# Patient Record
Sex: Male | Born: 1950 | Marital: Married | Smoking: Former smoker
Health system: Southern US, Community
[De-identification: ages and names within clinical notes are randomized; demographics above are authoritative.]

## PROBLEM LIST (undated history)

## (undated) ENCOUNTER — Ambulatory Visit: Admission: EM | Discharge status: Against Medical Advice | Payer: Medicare HMO

## (undated) DIAGNOSIS — E785 Hyperlipidemia, unspecified: Secondary | ICD-10-CM

## (undated) DIAGNOSIS — E119 Type 2 diabetes mellitus without complications: Secondary | ICD-10-CM

## (undated) DIAGNOSIS — I10 Essential (primary) hypertension: Secondary | ICD-10-CM

## (undated) HISTORY — DX: Essential (primary) hypertension: I10

## (undated) HISTORY — DX: Type 2 diabetes mellitus without complications: E11.9

## (undated) HISTORY — DX: Hyperlipidemia, unspecified: E78.5

---

## 2005-12-26 ENCOUNTER — Ambulatory Visit: Payer: Self-pay

## 2013-08-19 ENCOUNTER — Ambulatory Visit: Payer: Self-pay | Admitting: Family Medicine

## 2014-05-19 DIAGNOSIS — E785 Hyperlipidemia, unspecified: Secondary | ICD-10-CM | POA: Diagnosis not present

## 2014-05-19 DIAGNOSIS — I10 Essential (primary) hypertension: Secondary | ICD-10-CM | POA: Diagnosis not present

## 2014-05-19 DIAGNOSIS — E119 Type 2 diabetes mellitus without complications: Secondary | ICD-10-CM | POA: Diagnosis not present

## 2014-06-11 DIAGNOSIS — Z1211 Encounter for screening for malignant neoplasm of colon: Secondary | ICD-10-CM | POA: Diagnosis not present

## 2014-06-11 DIAGNOSIS — R1310 Dysphagia, unspecified: Secondary | ICD-10-CM | POA: Diagnosis not present

## 2014-08-05 DIAGNOSIS — E785 Hyperlipidemia, unspecified: Secondary | ICD-10-CM | POA: Diagnosis not present

## 2014-08-05 DIAGNOSIS — I10 Essential (primary) hypertension: Secondary | ICD-10-CM | POA: Diagnosis not present

## 2014-08-05 DIAGNOSIS — E119 Type 2 diabetes mellitus without complications: Secondary | ICD-10-CM | POA: Diagnosis not present

## 2014-08-05 DIAGNOSIS — Z0000 Encounter for general adult medical examination without abnormal findings: Secondary | ICD-10-CM | POA: Diagnosis not present

## 2014-08-05 DIAGNOSIS — N401 Enlarged prostate with lower urinary tract symptoms: Secondary | ICD-10-CM | POA: Diagnosis not present

## 2014-10-31 DIAGNOSIS — E785 Hyperlipidemia, unspecified: Secondary | ICD-10-CM

## 2014-10-31 DIAGNOSIS — I10 Essential (primary) hypertension: Secondary | ICD-10-CM | POA: Insufficient documentation

## 2014-10-31 DIAGNOSIS — E1159 Type 2 diabetes mellitus with other circulatory complications: Secondary | ICD-10-CM | POA: Insufficient documentation

## 2014-10-31 DIAGNOSIS — I152 Hypertension secondary to endocrine disorders: Secondary | ICD-10-CM | POA: Insufficient documentation

## 2014-10-31 DIAGNOSIS — E669 Obesity, unspecified: Secondary | ICD-10-CM | POA: Insufficient documentation

## 2014-10-31 DIAGNOSIS — E1169 Type 2 diabetes mellitus with other specified complication: Secondary | ICD-10-CM | POA: Insufficient documentation

## 2014-11-03 ENCOUNTER — Encounter: Payer: Self-pay | Admitting: Family Medicine

## 2014-11-03 ENCOUNTER — Ambulatory Visit (INDEPENDENT_AMBULATORY_CARE_PROVIDER_SITE_OTHER): Payer: Commercial Managed Care - HMO | Admitting: Family Medicine

## 2014-11-03 VITALS — BP 121/72 | HR 85 | Temp 97.9°F | Ht 62.8 in | Wt 158.0 lb

## 2014-11-03 DIAGNOSIS — E785 Hyperlipidemia, unspecified: Secondary | ICD-10-CM | POA: Diagnosis not present

## 2014-11-03 DIAGNOSIS — I10 Essential (primary) hypertension: Secondary | ICD-10-CM

## 2014-11-03 DIAGNOSIS — E119 Type 2 diabetes mellitus without complications: Secondary | ICD-10-CM

## 2014-11-03 LAB — BAYER DCA HB A1C WAIVED: HB A1C (BAYER DCA - WAIVED): 6.7 % (ref <7.0)

## 2014-11-03 NOTE — Assessment & Plan Note (Signed)
The current medical regimen is effective;  continue present plan and medications.  

## 2014-11-03 NOTE — Progress Notes (Signed)
   BP 121/72 mmHg  Pulse 85  Temp(Src) 97.9 F (36.6 C)  Ht 5' 2.8" (1.595 m)  Wt 158 lb (71.668 kg)  BMI 28.17 kg/m2  SpO2 97%   Subjective:    Patient ID: Edward Yang, male    DOB: 16-Sep-1950, 64 y.o.   MRN: [id number]  HPI: Edward Yang is a 64 y.o. male  Chief Complaint  Patient presents with  . Diabetes   patient doing well with diabetes no low blood sugar spells taking medications faithfully having good control. Blood pressure doing well no side effects from medications Cholesterol doing well no side effects from medications Takes medications faithfully  Relevant past medical, surgical, family and social history reviewed and updated as indicated. Interim medical history since our last visit reviewed. Allergies and medications reviewed and updated.  Review of Systems  Constitutional: Negative.   Respiratory: Negative.   Cardiovascular: Negative.     Per HPI unless specifically indicated above     Objective:    BP 121/72 mmHg  Pulse 85  Temp(Src) 97.9 F (36.6 C)  Ht 5' 2.8" (1.595 m)  Wt 158 lb (71.668 kg)  BMI 28.17 kg/m2  SpO2 97%  Wt Readings from Last 3 Encounters:  11/03/14 158 lb (71.668 kg)  08/05/14 156 lb (70.761 kg)    Physical Exam  Constitutional: He is oriented to person, place, and time. He appears well-developed and well-nourished. No distress.  HENT:  Head: Normocephalic and atraumatic.  Right Ear: Hearing normal.  Left Ear: Hearing normal.  Nose: Nose normal.  Eyes: Conjunctivae and lids are normal. Right eye exhibits no discharge. Left eye exhibits no discharge. No scleral icterus.  Cardiovascular: Normal rate, regular rhythm and normal heart sounds.   Pulmonary/Chest: Effort normal and breath sounds normal. No respiratory distress.  Musculoskeletal: Normal range of motion.  Neurological: He is alert and oriented to person, place, and time.  Skin: Skin is intact. No rash noted.  Psychiatric: He has a normal mood and affect. His  speech is normal and behavior is normal. Judgment and thought content normal. Cognition and memory are normal.    No results found for this or any previous visit.    Assessment & Plan:   Problem List Items Addressed This Visit      Cardiovascular and Mediastinum   Hypertension    The current medical regimen is effective;  continue present plan and medications.       Relevant Orders   Basic metabolic panel     Endocrine   Diabetes mellitus without complication - Primary    The current medical regimen is effective;  continue present plan and medications.       Relevant Orders   Bayer DCA Hb A1c Waived   Bayer DCA Hb A1c Waived     Other   Hyperlipidemia    The current medical regimen is effective;  continue present plan and medications.       Relevant Orders   LP+ALT+AST+Glu Piccolo, Waived       Follow up plan: Return in about 3 months (around 02/03/2015), or if symptoms worsen or fail to improve, for med check.

## 2014-12-11 ENCOUNTER — Ambulatory Visit: Payer: Medicare HMO

## 2014-12-11 DIAGNOSIS — Z23 Encounter for immunization: Secondary | ICD-10-CM

## 2014-12-24 LAB — HM DIABETES EYE EXAM

## 2015-01-26 ENCOUNTER — Encounter: Payer: Self-pay | Admitting: Family Medicine

## 2015-01-26 ENCOUNTER — Ambulatory Visit (INDEPENDENT_AMBULATORY_CARE_PROVIDER_SITE_OTHER): Payer: Commercial Managed Care - HMO | Admitting: Family Medicine

## 2015-01-26 DIAGNOSIS — E119 Type 2 diabetes mellitus without complications: Secondary | ICD-10-CM | POA: Diagnosis not present

## 2015-01-26 DIAGNOSIS — E785 Hyperlipidemia, unspecified: Secondary | ICD-10-CM

## 2015-01-26 DIAGNOSIS — I10 Essential (primary) hypertension: Secondary | ICD-10-CM

## 2015-01-26 LAB — LP+ALT+AST+GLU PICCOLO, WAIVED
ALT (SGPT) PICCOLO, WAIVED: 71 U/L — AB (ref 10–47)
AST (SGOT) Piccolo, Waived: 50 U/L — ABNORMAL HIGH (ref 11–38)
CHOLESTEROL PICCOLO, WAIVED: 181 mg/dL (ref <200)
Chol/HDL Ratio Piccolo,Waive: 2.8 mg/dL
Glucose Piccolo, Waived: 92 mg/dL (ref 73–118)
HDL CHOL PICCOLO, WAIVED: 64 mg/dL (ref >59)
LDL CHOL CALC PICCOLO WAIVED: 73 mg/dL (ref <100)
TRIGLYCERIDES PICCOLO,WAIVED: 216 mg/dL — AB (ref <150)
VLDL CHOL CALC PICCOLO,WAIVE: 43 mg/dL — AB (ref <30)

## 2015-01-26 LAB — BAYER DCA HB A1C WAIVED: HB A1C: 7.1 % — AB (ref <7.0)

## 2015-01-26 MED ORDER — METFORMIN HCL 500 MG PO TABS: 500.0000 mg | ORAL_TABLET | Freq: Two times a day (BID) | ORAL | Status: DC

## 2015-01-26 MED ORDER — LISINOPRIL 10 MG PO TABS: 10.0000 mg | ORAL_TABLET | Freq: Every day | ORAL | Status: DC

## 2015-01-26 MED ORDER — GLIPIZIDE 5 MG PO TABS: 5.0000 mg | ORAL_TABLET | Freq: Every day | ORAL | Status: DC

## 2015-01-26 MED ORDER — PRAVASTATIN SODIUM 40 MG PO TABS: 40.0000 mg | ORAL_TABLET | Freq: Every day | ORAL | Status: DC

## 2015-01-26 NOTE — Assessment & Plan Note (Signed)
The current medical regimen is effective;  continue present plan and medications.  

## 2015-01-26 NOTE — Progress Notes (Signed)
BP 110/68 mmHg  Pulse 80  Temp(Src) 98.5 F (36.9 C)  Ht 5' 2.8" (1.595 m)  Wt 157 lb (71.215 kg)  BMI 27.99 kg/m2  SpO2 98%   Subjective:    Patient ID: Edward Yang, male    DOB: 14-Aug-1950, 64 y.o.   MRN: [id number]  HPI: Edward Yang is a 64 y.o. male  Chief Complaint  Patient presents with  . Hypertension  . Hyperlipidemia  . Diabetes   patient doing well accompanied by interpreter who translates for patient. Patient largely self-sufficient for English Blood pressures been doing well no complaints from medications Cholesterol doing well no complaints from medications Diabetes doing well no complaints medications no low blood sugar spells  Relevant past medical, surgical, family and social history reviewed and updated as indicated. Interim medical history since our last visit reviewed. Allergies and medications reviewed and updated.  Review of Systems  Constitutional: Negative.   Respiratory: Negative.   Cardiovascular: Negative.     Per HPI unless specifically indicated above     Objective:    BP 110/68 mmHg  Pulse 80  Temp(Src) 98.5 F (36.9 C)  Ht 5' 2.8" (1.595 m)  Wt 157 lb (71.215 kg)  BMI 27.99 kg/m2  SpO2 98%  Wt Readings from Last 3 Encounters:  01/26/15 157 lb (71.215 kg)  11/03/14 158 lb (71.668 kg)  08/05/14 156 lb (70.761 kg)    Physical Exam  Constitutional: He is oriented to person, place, and time. He appears well-developed and well-nourished. No distress.  HENT:  Head: Normocephalic and atraumatic.  Right Ear: Hearing normal.  Left Ear: Hearing normal.  Nose: Nose normal.  Eyes: Conjunctivae and lids are normal. Right eye exhibits no discharge. Left eye exhibits no discharge. No scleral icterus.  Cardiovascular: Normal rate, regular rhythm and normal heart sounds.   Pulmonary/Chest: Effort normal and breath sounds normal. No respiratory distress.  Musculoskeletal: Normal range of motion.  Neurological: He is alert and oriented to  person, place, and time.  Skin: Skin is intact. No rash noted.  Psychiatric: He has a normal mood and affect. His speech is normal and behavior is normal. Judgment and thought content normal. Cognition and memory are normal.    Results for orders placed or performed in visit on 11/03/14  Bayer DCA Hb A1c Waived  Result Value Ref Range   Bayer DCA Hb A1c Waived 6.7 <7.0 %      Assessment & Plan:   Problem List Items Addressed This Visit      Cardiovascular and Mediastinum   Hypertension    The current medical regimen is effective;  continue present plan and medications.       Relevant Medications   lisinopril (PRINIVIL,ZESTRIL) 10 MG tablet   pravastatin (PRAVACHOL) 40 MG tablet     Endocrine   Diabetes mellitus without complication (HCC)    The current medical regimen is effective;  continue present plan and medications.       Relevant Medications   glipiZIDE (GLUCOTROL) 5 MG tablet   lisinopril (PRINIVIL,ZESTRIL) 10 MG tablet   metFORMIN (GLUCOPHAGE) 500 MG tablet   pravastatin (PRAVACHOL) 40 MG tablet     Other   Hyperlipidemia    The current medical regimen is effective;  continue present plan and medications.       Relevant Medications   lisinopril (PRINIVIL,ZESTRIL) 10 MG tablet   pravastatin (PRAVACHOL) 40 MG tablet      Reviewed with patient that although his numbers are  marginal and weight loss better diet exercise is important Due to elevated liver enzymes will check lipid panel ALT, AST Office visit Follow up plan: Return in about 3 months (around 04/28/2015) for Med check, hemoglobin A1c, lipid panel, ALT, AST.

## 2015-01-27 ENCOUNTER — Encounter: Payer: Self-pay | Admitting: Family Medicine

## 2015-01-27 LAB — BASIC METABOLIC PANEL
BUN/Creatinine Ratio: 15 (ref 10–22)
BUN: 16 mg/dL (ref 8–27)
CALCIUM: 9.9 mg/dL (ref 8.6–10.2)
CO2: 28 mmol/L (ref 18–29)
Chloride: 99 mmol/L (ref 97–106)
Creatinine, Ser: 1.04 mg/dL (ref 0.76–1.27)
GFR calc Af Amer: 86 mL/min/{1.73_m2} (ref >59)
GFR, EST NON AFRICAN AMERICAN: 74 mL/min/{1.73_m2} (ref >59)
Glucose: 87 mg/dL (ref 65–99)
POTASSIUM: 4.5 mmol/L (ref 3.5–5.2)
Sodium: 140 mmol/L (ref 136–144)

## 2015-04-27 ENCOUNTER — Encounter: Payer: Self-pay | Admitting: Family Medicine

## 2015-04-27 ENCOUNTER — Ambulatory Visit (INDEPENDENT_AMBULATORY_CARE_PROVIDER_SITE_OTHER): Payer: Commercial Managed Care - HMO | Admitting: Family Medicine

## 2015-04-27 VITALS — BP 104/64 | HR 71 | Temp 98.1°F | Ht 61.1 in | Wt 154.0 lb

## 2015-04-27 DIAGNOSIS — I10 Essential (primary) hypertension: Secondary | ICD-10-CM | POA: Diagnosis not present

## 2015-04-27 DIAGNOSIS — E119 Type 2 diabetes mellitus without complications: Secondary | ICD-10-CM

## 2015-04-27 DIAGNOSIS — E785 Hyperlipidemia, unspecified: Secondary | ICD-10-CM

## 2015-04-27 LAB — LP+ALT+AST PICCOLO, WAIVED
ALT (SGPT) Piccolo, Waived: 59 U/L — ABNORMAL HIGH (ref 10–47)
AST (SGOT) Piccolo, Waived: 48 U/L — ABNORMAL HIGH (ref 11–38)
CHOL/HDL RATIO PICCOLO,WAIVE: 3.1 mg/dL
CHOLESTEROL PICCOLO, WAIVED: 203 mg/dL — AB (ref <200)
HDL Chol Piccolo, Waived: 66 mg/dL (ref >59)
LDL CHOL CALC PICCOLO WAIVED: 105 mg/dL — AB (ref <100)
Triglycerides Piccolo,Waived: 160 mg/dL — ABNORMAL HIGH (ref <150)
VLDL Chol Calc Piccolo,Waive: 32 mg/dL — ABNORMAL HIGH (ref <30)

## 2015-04-27 LAB — BAYER DCA HB A1C WAIVED: HB A1C: 6.8 % (ref <7.0)

## 2015-04-27 NOTE — Assessment & Plan Note (Signed)
The current medical regimen is effective;  continue present plan and medications.  

## 2015-04-27 NOTE — Progress Notes (Signed)
BP 104/64 mmHg  Pulse 71  Temp(Src) 98.1 F (36.7 C)  Ht 5' 1.1" (1.552 m)  Wt 154 lb (69.854 kg)  BMI 29.00 kg/m2  SpO2 96%   Subjective:    Patient ID: Edward Yang, male    DOB: 02/18/1951, 65 y.o.   MRN: [id number]  HPI: Edward Yang is a 65 y.o. male  Chief Complaint  Patient presents with  . Diabetes  . Hyperlipidemia   patient here with interpreter who assists with language  doing well no complaints from medications as lost 4 pounds been trying to eat better No side effects from medications noted low blood sugar spells and taking faithfully Concerned about elevated liver enzymes last visit patient's been doing better with weight loss and diet Blood pressure doing well Relevant past medical, surgical, family and social history reviewed and updated as indicated. Interim medical history since our last visit reviewed. Allergies and medications reviewed and updated.  Review of Systems  Constitutional: Negative.   Respiratory: Negative.   Cardiovascular: Negative.     Per HPI unless specifically indicated above     Objective:    BP 104/64 mmHg  Pulse 71  Temp(Src) 98.1 F (36.7 C)  Ht 5' 1.1" (1.552 m)  Wt 154 lb (69.854 kg)  BMI 29.00 kg/m2  SpO2 96%  Wt Readings from Last 3 Encounters:  04/27/15 154 lb (69.854 kg)  01/26/15 157 lb (71.215 kg)  11/03/14 158 lb (71.668 kg)    Physical Exam  Constitutional: He is oriented to person, place, and time. He appears well-developed and well-nourished. No distress.  HENT:  Head: Normocephalic and atraumatic.  Right Ear: Hearing normal.  Left Ear: Hearing normal.  Nose: Nose normal.  Eyes: Conjunctivae and lids are normal. Right eye exhibits no discharge. Left eye exhibits no discharge. No scleral icterus.  Cardiovascular: Normal rate, regular rhythm and normal heart sounds.   Pulmonary/Chest: Effort normal and breath sounds normal. No respiratory distress.  Musculoskeletal: Normal range of motion.  Neurological:  He is alert and oriented to person, place, and time.  Skin: Skin is intact. No rash noted.  Psychiatric: He has a normal mood and affect. His speech is normal and behavior is normal. Judgment and thought content normal. Cognition and memory are normal.    Results for orders placed or performed in visit on 01/26/15  Bayer DCA Hb A1c Waived  Result Value Ref Range   Bayer DCA Hb A1c Waived 7.1 (H) <7.0 %  LP+ALT+AST+Glu Piccolo, Waived  Result Value Ref Range   ALT (SGPT) Piccolo, Waived 71 (H) 10 - 47 U/L   AST (SGOT) Piccolo, Waived 50 (H) 11 - 38 U/L   Glucose Piccolo, Waived 92 73 - 118 mg/dL   Cholesterol Piccolo, Waived 181 <200 mg/dL   HDL Chol Piccolo, Waived 64 >59 mg/dL   Triglycerides Piccolo,Waived 216 (H) <150 mg/dL   Chol/HDL Ratio Piccolo,Waive 2.8 mg/dL   LDL Chol Calc Piccolo Waived 73 <100 mg/dL   VLDL Chol Calc Piccolo,Waive 43 (H) <30 mg/dL  Basic metabolic panel  Result Value Ref Range   Glucose 87 65 - 99 mg/dL   BUN 16 8 - 27 mg/dL   Creatinine, Ser 4.01 0.76 - 1.27 mg/dL   GFR calc non Af Amer 74 >59 mL/min/1.73   GFR calc Af Amer 86 >59 mL/min/1.73   BUN/Creatinine Ratio 15 10 - 22   Sodium 140 136 - 144 mmol/L   Potassium 4.5 3.5 - 5.2 mmol/L  Chloride 99 97 - 106 mmol/L   CO2 28 18 - 29 mmol/L   Calcium 9.9 8.6 - 10.2 mg/dL      Assessment & Plan:   Problem List Items Addressed This Visit      Cardiovascular and Mediastinum   Hypertension    The current medical regimen is effective;  continue present plan and medications.         Endocrine   Diabetes mellitus without complication (HCC) - Primary    The current medical regimen is effective;  continue present plan and medications.       Relevant Orders   Bayer DCA Hb A1c Waived     Other   Hyperlipidemia    Lipids LDL are up isn't taking pravastatin but liver enzymes are improved will continue current dose work on improvement with diet       Other Visit Diagnoses    Hyperlipemia         Relevant Orders    LP+ALT+AST Piccolo, Waived        Follow up plan: Return in about 3 months (around 07/25/2015) for Physical Exam and A1C.

## 2015-04-27 NOTE — Assessment & Plan Note (Signed)
Lipids LDL are up isn't taking pravastatin but liver enzymes are improved will continue current dose work on improvement with diet

## 2015-08-12 ENCOUNTER — Encounter: Payer: Self-pay | Admitting: Family Medicine

## 2015-08-12 ENCOUNTER — Ambulatory Visit (INDEPENDENT_AMBULATORY_CARE_PROVIDER_SITE_OTHER): Payer: Commercial Managed Care - HMO | Admitting: Family Medicine

## 2015-08-12 VITALS — BP 104/63 | HR 85 | Temp 97.9°F | Ht 61.6 in | Wt 151.0 lb

## 2015-08-12 DIAGNOSIS — Z23 Encounter for immunization: Secondary | ICD-10-CM | POA: Diagnosis not present

## 2015-08-12 DIAGNOSIS — E785 Hyperlipidemia, unspecified: Secondary | ICD-10-CM

## 2015-08-12 DIAGNOSIS — I10 Essential (primary) hypertension: Secondary | ICD-10-CM | POA: Diagnosis not present

## 2015-08-12 DIAGNOSIS — E119 Type 2 diabetes mellitus without complications: Secondary | ICD-10-CM

## 2015-08-12 DIAGNOSIS — Z0000 Encounter for general adult medical examination without abnormal findings: Secondary | ICD-10-CM

## 2015-08-12 LAB — URINALYSIS, ROUTINE W REFLEX MICROSCOPIC
Bilirubin, UA: NEGATIVE
GLUCOSE, UA: NEGATIVE
KETONES UA: NEGATIVE
LEUKOCYTES UA: NEGATIVE
NITRITE UA: NEGATIVE
PROTEIN UA: NEGATIVE
RBC, UA: NEGATIVE
SPEC GRAV UA: 1.015 (ref 1.005–1.030)
Urobilinogen, Ur: 1 mg/dL (ref 0.2–1.0)
pH, UA: 5.5 (ref 5.0–7.5)

## 2015-08-12 LAB — BAYER DCA HB A1C WAIVED: HB A1C (BAYER DCA - WAIVED): 6.9 % (ref <7.0)

## 2015-08-12 MED ORDER — LISINOPRIL 10 MG PO TABS: 10.0000 mg | ORAL_TABLET | Freq: Every day | ORAL | Status: DC

## 2015-08-12 MED ORDER — METFORMIN HCL 500 MG PO TABS: 500.0000 mg | ORAL_TABLET | Freq: Two times a day (BID) | ORAL | Status: DC

## 2015-08-12 MED ORDER — PRAVASTATIN SODIUM 40 MG PO TABS: 40.0000 mg | ORAL_TABLET | Freq: Every day | ORAL | Status: DC

## 2015-08-12 MED ORDER — GLIPIZIDE 5 MG PO TABS
5.0000 mg | ORAL_TABLET | Freq: Every day | ORAL | Status: DC
Start: 2015-08-12 — End: 2016-08-20

## 2015-08-12 NOTE — Assessment & Plan Note (Signed)
The current medical regimen is effective;  continue present plan and medications.  

## 2015-08-12 NOTE — Patient Instructions (Signed)
Pneumococcal Conjugate Vaccine (PCV13)   1. Why get vaccinated?  Vaccination can protect both children and adults from pneumococcal disease.  Pneumococcal disease is caused by bacteria that can spread from person to person through close contact. It can cause ear infections, and it can also lead to more serious infections of the:  · Lungs (pneumonia),  · Blood (bacteremia), and  · Covering of the brain and spinal cord (meningitis).  Pneumococcal pneumonia is most common among adults. Pneumococcal meningitis can cause deafness and brain damage, and it kills about 1 child in 10 who get it.  Anyone can get pneumococcal disease, but children under 2 years of age and adults 65 years and older, people with certain medical conditions, and cigarette smokers are at the highest risk.  Before there was a vaccine, the [place] saw:  · more than 700 cases of meningitis,  · about 13,000 blood infections,  · about 5 million ear infections, and  · about 200 deaths  in children under 5 each year from pneumococcal disease. Since vaccine became available, severe pneumococcal disease in these children has fallen by 88%.  About 18,000 older adults die of pneumococcal disease each year in the [place].  Treatment of pneumococcal infections with penicillin and other drugs is not as effective as it used to be, because some strains of the disease have become resistant to these drugs. This makes prevention of the disease, through vaccination, even more important.  2. PCV13 vaccine  Pneumococcal conjugate vaccine (called PCV13) protects against 13 types of pneumococcal bacteria.  PCV13 is routinely given to children at 2, 4, 6, and 12-15 months of age. It is also recommended for children and adults 2 to 64 years of age with certain health conditions, and for all adults 65 years of age and older. Your doctor can give you details.  3. Some people should not get this vaccine  Anyone who has ever had a life-threatening allergic reaction  to a dose of this vaccine, to an earlier pneumococcal vaccine called PCV7, or to any vaccine containing diphtheria toxoid (for example, DTaP), should not get PCV13.  Anyone with a severe allergy to any component of PCV13 should not get the vaccine. Tell your doctor if the person being vaccinated has any severe allergies.  If the person scheduled for vaccination is not feeling well, your healthcare provider might decide to reschedule the shot on another day.  4. Risks of a vaccine reaction  With any medicine, including vaccines, there is a chance of reactions. These are usually mild and go away on their own, but serious reactions are also possible.  Problems reported following PCV13 varied by age and dose in the series. The most common problems reported among children were:  · About half became drowsy after the shot, had a temporary loss of appetite, or had redness or tenderness where the shot was given.  · About 1 out of 3 had swelling where the shot was given.  · About 1 out of 3 had a mild fever, and about 1 in 20 had a fever over 102.2°F.  · Up to about 8 out of 10 became fussy or irritable.  Adults have reported pain, redness, and swelling where the shot was given; also mild fever, fatigue, headache, chills, or muscle pain.  Young children who get PCV13 along with inactivated flu vaccine at the same time may be at increased risk for seizures caused by fever. Ask your doctor for more information.  Problems that   could happen after any vaccine:  · People sometimes faint after a medical procedure, including vaccination. Sitting or lying down for about 15 minutes can help prevent fainting, and injuries caused by a fall. Tell your doctor if you feel dizzy, or have vision changes or ringing in the ears.  · Some older children and adults get severe pain in the shoulder and have difficulty moving the arm where a shot was given. This happens very rarely.  · Any medication can cause a severe allergic reaction. Such  reactions from a vaccine are very rare, estimated at about 1 in a million doses, and would happen within a few minutes to a few hours after the vaccination.  As with any medicine, there is a very small chance of a vaccine causing a serious injury or death.  The safety of vaccines is always being monitored. For more information, visit: www.cdc.gov/vaccinesafety/  5. What if there is a serious reaction?  What should I look for?  · Look for anything that concerns you, such as signs of a severe allergic reaction, very high fever, or unusual behavior.  Signs of a severe allergic reaction can include hives, swelling of the face and throat, difficulty breathing, a fast heartbeat, dizziness, and weakness-usually within a few minutes to a few hours after the vaccination.  What should I do?  · If you think it is a severe allergic reaction or other emergency that can't wait, call 9-1-1 or get the person to the nearest hospital. Otherwise, call your doctor.  Reactions should be reported to the Vaccine Adverse Event Reporting System (VAERS). Your doctor should file this report, or you can do it yourself through the VAERS web site at www.vaers.hhs.gov, or by calling 1-800-822-7967.  VAERS does not give medical advice.  6. The National Vaccine Injury Compensation Program  The National Vaccine Injury Compensation Program (VICP) is a federal program that was created to compensate people who may have been injured by certain vaccines.  Persons who believe they may have been injured by a vaccine can learn about the program and about filing a claim by calling 1-800-338-2382 or visiting the VICP website at www.hrsa.gov/vaccinecompensation. There is a time limit to file a claim for compensation.  7. How can I learn more?  · Ask your healthcare provider. He or she can give you the vaccine package insert or suggest other sources of information.  · Call your local or state health department.  · Contact the Centers for Disease Control and  Prevention (CDC):    Call 1-800-232-4636 (1-800-CDC-INFO) or    Visit CDC's website at www.cdc.gov/vaccines  Vaccine Information Statement  PCV13 Vaccine (01/09/2014)     This information is not intended to replace advice given to you by your health care provider. Make sure you discuss any questions you have with your health care provider.     Document Released: 12/19/2005 Document Revised: 03/14/2014 Document Reviewed: 01/16/2014  Elsevier Interactive Patient Education ©2016 [employer]  Pneumococcal Conjugate Vaccine (PCV13)   1. Why get vaccinated?  Vaccination can protect both children and adults from pneumococcal disease.  Pneumococcal disease is caused by bacteria that can spread from person to person through close contact. It can cause ear infections, and it can also lead to more serious infections of the:  · Lungs (pneumonia),  · Blood (bacteremia), and  · Covering of the brain and spinal cord (meningitis).  Pneumococcal pneumonia is most common among adults. Pneumococcal meningitis can cause deafness and brain damage, and it   kills about 1 child in 10 who get it.  Anyone can get pneumococcal disease, but children under 2 years of age and adults 65 years and older, people with certain medical conditions, and cigarette smokers are at the highest risk.  Before there was a vaccine, the [place] saw:  · more than 700 cases of meningitis,  · about 13,000 blood infections,  · about 5 million ear infections, and  · about 200 deaths  in children under 5 each year from pneumococcal disease. Since vaccine became available, severe pneumococcal disease in these children has fallen by 88%.  About 18,000 older adults die of pneumococcal disease each year in the [place].  Treatment of pneumococcal infections with penicillin and other drugs is not as effective as it used to be, because some strains of the disease have become resistant to these drugs. This makes prevention of the disease, through vaccination,  even more important.  2. PCV13 vaccine  Pneumococcal conjugate vaccine (called PCV13) protects against 13 types of pneumococcal bacteria.  PCV13 is routinely given to children at 2, 4, 6, and 12-15 months of age. It is also recommended for children and adults 2 to 64 years of age with certain health conditions, and for all adults 65 years of age and older. Your doctor can give you details.  3. Some people should not get this vaccine  Anyone who has ever had a life-threatening allergic reaction to a dose of this vaccine, to an earlier pneumococcal vaccine called PCV7, or to any vaccine containing diphtheria toxoid (for example, DTaP), should not get PCV13.  Anyone with a severe allergy to any component of PCV13 should not get the vaccine. Tell your doctor if the person being vaccinated has any severe allergies.  If the person scheduled for vaccination is not feeling well, your healthcare provider might decide to reschedule the shot on another day.  4. Risks of a vaccine reaction  With any medicine, including vaccines, there is a chance of reactions. These are usually mild and go away on their own, but serious reactions are also possible.  Problems reported following PCV13 varied by age and dose in the series. The most common problems reported among children were:  · About half became drowsy after the shot, had a temporary loss of appetite, or had redness or tenderness where the shot was given.  · About 1 out of 3 had swelling where the shot was given.  · About 1 out of 3 had a mild fever, and about 1 in 20 had a fever over 102.2°F.  · Up to about 8 out of 10 became fussy or irritable.  Adults have reported pain, redness, and swelling where the shot was given; also mild fever, fatigue, headache, chills, or muscle pain.  Young children who get PCV13 along with inactivated flu vaccine at the same time may be at increased risk for seizures caused by fever. Ask your doctor for more information.  Problems that could happen  after any vaccine:  · People sometimes faint after a medical procedure, including vaccination. Sitting or lying down for about 15 minutes can help prevent fainting, and injuries caused by a fall. Tell your doctor if you feel dizzy, or have vision changes or ringing in the ears.  · Some older children and adults get severe pain in the shoulder and have difficulty moving the arm where a shot was given. This happens very rarely.  · Any medication can cause a severe allergic reaction. Such reactions   from a vaccine are very rare, estimated at about 1 in a million doses, and would happen within a few minutes to a few hours after the vaccination.  As with any medicine, there is a very small chance of a vaccine causing a serious injury or death.  The safety of vaccines is always being monitored. For more information, visit: www.cdc.gov/vaccinesafety/  5. What if there is a serious reaction?  What should I look for?  · Look for anything that concerns you, such as signs of a severe allergic reaction, very high fever, or unusual behavior.  Signs of a severe allergic reaction can include hives, swelling of the face and throat, difficulty breathing, a fast heartbeat, dizziness, and weakness-usually within a few minutes to a few hours after the vaccination.  What should I do?  · If you think it is a severe allergic reaction or other emergency that can't wait, call 9-1-1 or get the person to the nearest hospital. Otherwise, call your doctor.  Reactions should be reported to the Vaccine Adverse Event Reporting System (VAERS). Your doctor should file this report, or you can do it yourself through the VAERS web site at www.vaers.hhs.gov, or by calling 1-800-822-7967.  VAERS does not give medical advice.  6. The National Vaccine Injury Compensation Program  The National Vaccine Injury Compensation Program (VICP) is a federal program that was created to compensate people who may have been injured by certain vaccines.  Persons who  believe they may have been injured by a vaccine can learn about the program and about filing a claim by calling 1-800-338-2382 or visiting the VICP website at www.hrsa.gov/vaccinecompensation. There is a time limit to file a claim for compensation.  7. How can I learn more?  · Ask your healthcare provider. He or she can give you the vaccine package insert or suggest other sources of information.  · Call your local or state health department.  · Contact the Centers for Disease Control and Prevention (CDC):    Call 1-800-232-4636 (1-800-CDC-INFO) or    Visit CDC's website at www.cdc.gov/vaccines  Vaccine Information Statement  PCV13 Vaccine (01/09/2014)     This information is not intended to replace advice given to you by your health care provider. Make sure you discuss any questions you have with your health care provider.     Document Released: 12/19/2005 Document Revised: 03/14/2014 Document Reviewed: 01/16/2014  Elsevier Interactive Patient Education ©2016 [employer]

## 2015-08-12 NOTE — Progress Notes (Signed)
BP 104/63 mmHg  Pulse 85  Temp(Src) 97.9 F (36.6 C)  Ht 5' 1.6" (1.565 m)  Wt 151 lb (68.493 kg)  BMI 27.97 kg/m2  SpO2 95%   Subjective:    Patient ID: Edward Yang, male    DOB: 08/15/1950, 65 y.o.   MRN: [id number]  HPI: Edward Yang is a 65 y.o. male  Chief Complaint  Patient presents with  . Annual Exam  . Diabetes   Patient recheck for physical all in all doing well with no complaints noted low blood sugar spells no issues with medications good control of blood pressure Blood sugar doing well taking Pravachol without problems last visit liver enzymes were slightly elevated Relevant past medical, surgical, family and social history reviewed and updated as indicated. Interim medical history since our last visit reviewed. Allergies and medications reviewed and updated.  Review of Systems  Constitutional: Negative.   HENT: Negative.   Eyes: Negative.   Respiratory: Negative.   Cardiovascular: Negative.   Gastrointestinal: Negative.   Endocrine: Negative.   Genitourinary: Negative.   Musculoskeletal: Negative.   Skin: Negative.   Allergic/Immunologic: Negative.   Neurological: Negative.   Hematological: Negative.   Psychiatric/Behavioral: Negative.     Per HPI unless specifically indicated above     Objective:    BP 104/63 mmHg  Pulse 85  Temp(Src) 97.9 F (36.6 C)  Ht 5' 1.6" (1.565 m)  Wt 151 lb (68.493 kg)  BMI 27.97 kg/m2  SpO2 95%  Wt Readings from Last 3 Encounters:  08/12/15 151 lb (68.493 kg)  04/27/15 154 lb (69.854 kg)  01/26/15 157 lb (71.215 kg)    Physical Exam  Constitutional: He is oriented to person, place, and time. He appears well-developed and well-nourished.  HENT:  Head: Normocephalic and atraumatic.  Right Ear: External ear normal.  Left Ear: External ear normal.  Eyes: Conjunctivae and EOM are normal. Pupils are equal, round, and reactive to light.  Neck: Normal range of motion. Neck supple.  Cardiovascular: Normal rate,  regular rhythm, normal heart sounds and intact distal pulses.   Pulmonary/Chest: Effort normal and breath sounds normal.  Abdominal: Soft. Bowel sounds are normal. There is no splenomegaly or hepatomegaly.  Genitourinary: Rectum normal and penis normal.  Prostate mild enlarged  Musculoskeletal: Normal range of motion.  Neurological: He is alert and oriented to person, place, and time. He has normal reflexes.  Skin: No rash noted. No erythema.  Psychiatric: He has a normal mood and affect. His behavior is normal. Judgment and thought content normal.    Results for orders placed or performed in visit on 04/27/15  LP+ALT+AST Piccolo, Waived  Result Value Ref Range   ALT (SGPT) Piccolo, Waived 59 (H) 10 - 47 U/L   AST (SGOT) Piccolo, Waived 48 (H) 11 - 38 U/L   Cholesterol Piccolo, Waived 203 (H) <200 mg/dL   HDL Chol Piccolo, Waived 66 >59 mg/dL   Triglycerides Piccolo,Waived 160 (H) <150 mg/dL   Chol/HDL Ratio Piccolo,Waive 3.1 mg/dL   LDL Chol Calc Piccolo Waived 105 (H) <100 mg/dL   VLDL Chol Calc Piccolo,Waive 32 (H) <30 mg/dL  Bayer DCA Hb N8G Waived  Result Value Ref Range   Bayer DCA Hb A1c Waived 6.8 <7.0 %      Assessment & Plan:   Problem List Items Addressed This Visit      Cardiovascular and Mediastinum   Hypertension    The current medical regimen is effective;  continue present plan and  medications.       Relevant Medications   lisinopril (PRINIVIL,ZESTRIL) 10 MG tablet   pravastatin (PRAVACHOL) 40 MG tablet     Endocrine   Diabetes mellitus without complication (HCC)    The current medical regimen is effective;  continue present plan and medications.       Relevant Medications   glipiZIDE (GLUCOTROL) 5 MG tablet   lisinopril (PRINIVIL,ZESTRIL) 10 MG tablet   metFORMIN (GLUCOPHAGE) 500 MG tablet   pravastatin (PRAVACHOL) 40 MG tablet   Other Relevant Orders   Bayer DCA Hb A1c Waived     Other   Hyperlipidemia    The current medical regimen is  effective;  continue present plan and medications.       Relevant Medications   lisinopril (PRINIVIL,ZESTRIL) 10 MG tablet   pravastatin (PRAVACHOL) 40 MG tablet    Other Visit Diagnoses    Immunization due    -  Primary    Relevant Orders    Pneumococcal conjugate vaccine 13-valent IM (Completed)    Routine general medical examination at a health care facility        Relevant Orders    CBC with Differential/Platelet    Comprehensive metabolic panel    Lipid Panel w/o Chol/HDL Ratio    PSA    TSH    Urinalysis, Routine w reflex microscopic ([facility])    Healthcare maintenance        Relevant Orders    Hepatitis C Antibody        Follow up plan: Return in about 3 months (around 11/12/2015) for A1c.

## 2015-08-13 ENCOUNTER — Encounter: Payer: Self-pay | Admitting: Unknown Physician Specialty

## 2015-08-13 LAB — COMPREHENSIVE METABOLIC PANEL
ALT: 29 IU/L (ref 0–44)
AST: 21 IU/L (ref 0–40)
Albumin/Globulin Ratio: 1.6 (ref 1.2–2.2)
Albumin: 4.4 g/dL (ref 3.6–4.8)
Alkaline Phosphatase: 85 IU/L (ref 39–117)
BUN/Creatinine Ratio: 20 (ref 10–24)
BUN: 20 mg/dL (ref 8–27)
Bilirubin Total: 0.9 mg/dL (ref 0.0–1.2)
CO2: 23 mmol/L (ref 18–29)
Calcium: 9.6 mg/dL (ref 8.6–10.2)
Chloride: 98 mmol/L (ref 96–106)
Creatinine, Ser: 1 mg/dL (ref 0.76–1.27)
GFR calc Af Amer: 90 mL/min/{1.73_m2} (ref >59)
GFR calc non Af Amer: 78 mL/min/{1.73_m2} (ref >59)
Globulin, Total: 2.7 g/dL (ref 1.5–4.5)
Glucose: 83 mg/dL (ref 65–99)
Potassium: 4.4 mmol/L (ref 3.5–5.2)
Sodium: 139 mmol/L (ref 134–144)
Total Protein: 7.1 g/dL (ref 6.0–8.5)

## 2015-08-13 LAB — CBC WITH DIFFERENTIAL/PLATELET
BASOS ABS: 0 10*3/uL (ref 0.0–0.2)
Basos: 0 %
EOS (ABSOLUTE): 0.3 10*3/uL (ref 0.0–0.4)
Eos: 4 %
HEMOGLOBIN: 14.8 g/dL (ref 12.6–17.7)
Hematocrit: 43.3 % (ref 37.5–51.0)
IMMATURE GRANS (ABS): 0 10*3/uL (ref 0.0–0.1)
Immature Granulocytes: 0 %
LYMPHS ABS: 2.5 10*3/uL (ref 0.7–3.1)
LYMPHS: 34 %
MCH: 31.2 pg (ref 26.6–33.0)
MCHC: 34.2 g/dL (ref 31.5–35.7)
MCV: 91 fL (ref 79–97)
MONOCYTES: 8 %
Monocytes Absolute: 0.6 10*3/uL (ref 0.1–0.9)
Neutrophils Absolute: 4.1 10*3/uL (ref 1.4–7.0)
Neutrophils: 54 %
PLATELETS: 154 10*3/uL (ref 150–379)
RBC: 4.75 x10E6/uL (ref 4.14–5.80)
RDW: 13.3 % (ref 12.3–15.4)
WBC: 7.5 10*3/uL (ref 3.4–10.8)

## 2015-08-13 LAB — LIPID PANEL W/O CHOL/HDL RATIO
CHOLESTEROL TOTAL: 148 mg/dL (ref 100–199)
HDL: 68 mg/dL (ref >39)
LDL Calculated: 61 mg/dL (ref 0–99)
TRIGLYCERIDES: 96 mg/dL (ref 0–149)
VLDL CHOLESTEROL CAL: 19 mg/dL (ref 5–40)

## 2015-08-13 LAB — HEPATITIS C ANTIBODY: Hep C Virus Ab: <0.1 s/co ratio (ref 0.0–0.9)

## 2015-08-13 LAB — TSH: TSH: 1.48 u[IU]/mL (ref 0.450–4.500)

## 2015-08-13 LAB — PSA: Prostate Specific Ag, Serum: 0.6 ng/mL (ref 0.0–4.0)

## 2015-08-13 NOTE — Progress Notes (Signed)
Quick Note:  Normal labs. Patient notified by letter. ______ 

## 2015-11-16 ENCOUNTER — Encounter: Payer: Self-pay | Admitting: Family Medicine

## 2015-11-16 ENCOUNTER — Ambulatory Visit (INDEPENDENT_AMBULATORY_CARE_PROVIDER_SITE_OTHER): Payer: Commercial Managed Care - HMO | Admitting: Family Medicine

## 2015-11-16 VITALS — BP 119/70 | HR 80 | Temp 97.4°F | Wt 155.0 lb

## 2015-11-16 DIAGNOSIS — Z23 Encounter for immunization: Secondary | ICD-10-CM

## 2015-11-16 DIAGNOSIS — E119 Type 2 diabetes mellitus without complications: Secondary | ICD-10-CM

## 2015-11-16 DIAGNOSIS — I10 Essential (primary) hypertension: Secondary | ICD-10-CM

## 2015-11-16 DIAGNOSIS — E785 Hyperlipidemia, unspecified: Secondary | ICD-10-CM | POA: Diagnosis not present

## 2015-11-16 LAB — MICROALBUMIN, URINE WAIVED
CREATININE, URINE WAIVED: 50 mg/dL (ref 10–300)
Microalb, Ur Waived: 10 mg/L (ref 0–19)

## 2015-11-16 LAB — BAYER DCA HB A1C WAIVED: HB A1C (BAYER DCA - WAIVED): 7 % — ABNORMAL HIGH (ref <7.0)

## 2015-11-16 NOTE — Assessment & Plan Note (Signed)
The current medical regimen is effective;  continue present plan and medications.  

## 2015-11-16 NOTE — Progress Notes (Signed)
BP 119/70 (BP Location: Right Arm, Cuff Size: Small)   Pulse 80   Temp 97.4 F (36.3 C)   Wt 155 lb (70.3 kg) Comment: with shoes  SpO2 98%   BMI 28.72 kg/m    Subjective:    Patient ID: Edward Yang, male    DOB: [birth year], 65 y.o.   MRN: [id number]  HPI: Edward Phoenixsaac Y Yoak is a 65 y.o. male  Chief Complaint  Patient presents with  . Diabetes  Patient recheck diabetes been doing well noted low glucose spells here with interpreter who helps patient with speaking. No complaints from blood pressure or cholesterol medication taking faithfully also Patient has some slight urinary leakage just prior to urination will drip one or 2 drops of urine in his underpants when he has to go the bathroom badly. Otherwise no issues with urinary incontinence.  Relevant past medical, surgical, family and social history reviewed and updated as indicated. Interim medical history since our last visit reviewed. Allergies and medications reviewed and updated.  Review of Systems  Constitutional: Negative.   Respiratory: Negative.   Cardiovascular: Negative.     Per HPI unless specifically indicated above     Objective:    BP 119/70 (BP Location: Right Arm, Cuff Size: Small)   Pulse 80   Temp 97.4 F (36.3 C)   Wt 155 lb (70.3 kg) Comment: with shoes  SpO2 98%   BMI 28.72 kg/m   Wt Readings from Last 3 Encounters:  11/16/15 155 lb (70.3 kg)  08/12/15 151 lb (68.5 kg)  04/27/15 154 lb (69.9 kg)    Physical Exam  Constitutional: He is oriented to person, place, and time. He appears well-developed and well-nourished. No distress.  HENT:  Head: Normocephalic and atraumatic.  Right Ear: Hearing normal.  Left Ear: Hearing normal.  Nose: Nose normal.  Eyes: Conjunctivae and lids are normal. Right eye exhibits no discharge. Left eye exhibits no discharge. No scleral icterus.  Cardiovascular: Normal rate and regular rhythm.   Pulmonary/Chest: Effort normal and breath sounds normal. No respiratory  distress.  Musculoskeletal: Normal range of motion.  Neurological: He is alert and oriented to person, place, and time.  Skin: Skin is intact. No rash noted.  Psychiatric: He has a normal mood and affect. His speech is normal and behavior is normal. Judgment and thought content normal. Cognition and memory are normal.    Results for orders placed or performed in visit on 08/18/15  HM DIABETES EYE EXAM  Result Value Ref Range   HM Diabetic Eye Exam No Retinopathy No Retinopathy      Assessment & Plan:   Problem List Items Addressed This Visit      Cardiovascular and Mediastinum   Hypertension    The current medical regimen is effective;  continue present plan and medications.         Endocrine   Diabetes mellitus without complication (HCC) - Primary    The current medical regimen is effective;  continue present plan and medications.       Relevant Orders   Bayer DCA Hb A1c Waived   Microalbumin, Urine Waived     Other   Hyperlipidemia    The current medical regimen is effective;  continue present plan and medications.        Other Visit Diagnoses    Need for Tdap vaccination       Relevant Orders   Tdap vaccine greater than or equal to 7yo IM   Immunization due  Relevant Orders   Flu vaccine HIGH DOSE PF (Fluzone High dose)       Follow up plan: Return in about 3 months (around 02/15/2016) for Hemoglobin A1c, BMP,  Lipids, ALT, AST.

## 2016-02-15 ENCOUNTER — Encounter: Payer: Self-pay | Admitting: Family Medicine

## 2016-02-15 ENCOUNTER — Ambulatory Visit (INDEPENDENT_AMBULATORY_CARE_PROVIDER_SITE_OTHER): Payer: Commercial Managed Care - HMO | Admitting: Family Medicine

## 2016-02-15 VITALS — BP 114/70 | HR 90 | Temp 97.6°F | Ht 62.25 in | Wt 155.2 lb

## 2016-02-15 DIAGNOSIS — Z1211 Encounter for screening for malignant neoplasm of colon: Secondary | ICD-10-CM | POA: Diagnosis not present

## 2016-02-15 DIAGNOSIS — E119 Type 2 diabetes mellitus without complications: Secondary | ICD-10-CM | POA: Diagnosis not present

## 2016-02-15 DIAGNOSIS — I10 Essential (primary) hypertension: Secondary | ICD-10-CM | POA: Diagnosis not present

## 2016-02-15 DIAGNOSIS — E785 Hyperlipidemia, unspecified: Secondary | ICD-10-CM | POA: Diagnosis not present

## 2016-02-15 LAB — LP+ALT+AST PICCOLO, WAIVED
ALT (SGPT) Piccolo, Waived: 39 U/L (ref 10–47)
AST (SGOT) Piccolo, Waived: 32 U/L (ref 11–38)
CHOL/HDL RATIO PICCOLO,WAIVE: 2.4 mg/dL
Cholesterol Piccolo, Waived: 161 mg/dL (ref <200)
HDL Chol Piccolo, Waived: 66 mg/dL (ref >59)
LDL CHOL CALC PICCOLO WAIVED: 64 mg/dL (ref <100)
Triglycerides Piccolo,Waived: 156 mg/dL — ABNORMAL HIGH (ref <150)
VLDL CHOL CALC PICCOLO,WAIVE: 31 mg/dL — AB (ref <30)

## 2016-02-15 LAB — BAYER DCA HB A1C WAIVED: HB A1C (BAYER DCA - WAIVED): 7 % — ABNORMAL HIGH (ref <7.0)

## 2016-02-15 NOTE — Assessment & Plan Note (Signed)
The current medical regimen is effective;  continue present plan and medications.  

## 2016-02-15 NOTE — Progress Notes (Signed)
BP 114/70 (BP Location: Left Arm, Patient Position: Sitting, Cuff Size: Normal)   Pulse 90   Temp 97.6 F (36.4 C)   Ht 5' 2.25" (1.581 m)   Wt 155 lb 3.2 oz (70.4 kg)   SpO2 95%   BMI 28.16 kg/m    Subjective:    Patient ID: Edward Yang, male    DOB: 1951/02/26, 65 y.o.   MRN: [id number]  HPI: Edward Yang is a 65 y.o. male  Chief Complaint  Patient presents with  . Hypertension  . Hyperlipidemia  . Diabetes   Patient follow-up doing well concerned hasn't had colon cancer screening wants referral for colonoscopy. Also hasn't had a shingles vaccine once to get shingles vaccine will give prescription and patient will check with insurance on payment. Other conditions blood pressure good control no issues with medications. Cholesterol no problems or concerns from pravastatin. Diabetes noted low blood sugar spells or issues no problems with diabetes control.  Relevant past medical, surgical, family and social history reviewed and updated as indicated. Interim medical history since our last visit reviewed. Allergies and medications reviewed and updated.  Review of Systems  Constitutional: Negative.   Respiratory: Negative.   Cardiovascular: Negative.     Per HPI unless specifically indicated above     Objective:    BP 114/70 (BP Location: Left Arm, Patient Position: Sitting, Cuff Size: Normal)   Pulse 90   Temp 97.6 F (36.4 C)   Ht 5' 2.25" (1.581 m)   Wt 155 lb 3.2 oz (70.4 kg)   SpO2 95%   BMI 28.16 kg/m   Wt Readings from Last 3 Encounters:  02/15/16 155 lb 3.2 oz (70.4 kg)  11/16/15 155 lb (70.3 kg)  08/12/15 151 lb (68.5 kg)    Physical Exam  Constitutional: He is oriented to person, place, and time. He appears well-developed and well-nourished. No distress.  HENT:  Head: Normocephalic and atraumatic.  Right Ear: Hearing normal.  Left Ear: Hearing normal.  Nose: Nose normal.  Eyes: Conjunctivae and lids are normal. Right eye exhibits no discharge.  Left eye exhibits no discharge. No scleral icterus.  Cardiovascular: Normal rate, regular rhythm and normal heart sounds.   Pulmonary/Chest: Effort normal and breath sounds normal. No respiratory distress.  Musculoskeletal: Normal range of motion.  Neurological: He is alert and oriented to person, place, and time.  Skin: Skin is intact. No rash noted.  Psychiatric: He has a normal mood and affect. His speech is normal and behavior is normal. Judgment and thought content normal. Cognition and memory are normal.    Results for orders placed or performed in visit on 11/16/15  Bayer DCA Hb A1c Waived  Result Value Ref Range   Bayer DCA Hb A1c Waived 7.0 (H) <7.0 %  Microalbumin, Urine Waived  Result Value Ref Range   Microalb, Ur Waived 10 0 - 19 mg/L   Creatinine, Urine Waived 50 10 - 300 mg/dL   Microalb/Creat Ratio 30-300 (H) <30 mg/g      Assessment & Plan:   Problem List Items Addressed This Visit      Cardiovascular and Mediastinum   Hypertension - Primary    The current medical regimen is effective;  continue present plan and medications.       Relevant Orders   Basic metabolic panel   LP+ALT+AST [place], [place]     Endocrine   Diabetes mellitus without complication (HCC)    The current medical regimen is effective;  continue  present plan and medications.       Relevant Orders   Basic metabolic panel     Other   Hyperlipidemia    The current medical regimen is effective;  continue present plan and medications.       Relevant Orders   Basic metabolic panel   Bayer DCA Hb [id number] Waived    Other Visit Diagnoses    Colon cancer screening       Relevant Orders   Ambulatory referral to Gastroenterology       Follow up plan: Return in about 3 months (around 05/15/2016) for Hemoglobin A1c.

## 2016-02-16 ENCOUNTER — Encounter: Payer: Self-pay | Admitting: Family Medicine

## 2016-02-16 LAB — BASIC METABOLIC PANEL
BUN/Creatinine Ratio: 15 (ref 10–24)
BUN: 14 mg/dL (ref 8–27)
CALCIUM: 9.5 mg/dL (ref 8.6–10.2)
CO2: 25 mmol/L (ref 18–29)
CREATININE: 0.96 mg/dL (ref 0.76–1.27)
Chloride: 100 mmol/L (ref 96–106)
GFR, EST AFRICAN AMERICAN: 94 mL/min/{1.73_m2} (ref >59)
GFR, EST NON AFRICAN AMERICAN: 81 mL/min/{1.73_m2} (ref >59)
Glucose: 129 mg/dL — ABNORMAL HIGH (ref 65–99)
POTASSIUM: 4.3 mmol/L (ref 3.5–5.2)
Sodium: 139 mmol/L (ref 134–144)

## 2016-03-01 ENCOUNTER — Ambulatory Visit
Admission: EM | Admit: 2016-03-01 | Discharge: 2016-03-01 | Disposition: A | Discharge status: Home / Self Care | Payer: Commercial Managed Care - HMO | Attending: Internal Medicine | Admitting: Internal Medicine

## 2016-03-01 ENCOUNTER — Ambulatory Visit (INDEPENDENT_AMBULATORY_CARE_PROVIDER_SITE_OTHER): Payer: Commercial Managed Care - HMO

## 2016-03-01 ENCOUNTER — Encounter: Payer: Self-pay | Admitting: Emergency Medicine

## 2016-03-01 DIAGNOSIS — J40 Bronchitis, not specified as acute or chronic: Secondary | ICD-10-CM

## 2016-03-01 DIAGNOSIS — R05 Cough: Secondary | ICD-10-CM | POA: Diagnosis not present

## 2016-03-01 MED ORDER — AZITHROMYCIN 250 MG PO TABS: ORAL_TABLET | ORAL | 0 refills | Status: DC

## 2016-03-01 NOTE — ED Provider Notes (Signed)
CSN: [id number]     Arrival date & time 03/01/16  1759 History   First MD Initiated Contact with Patient 03/01/16 1836     Chief Complaint  Patient presents with  . Cough   (Consider location/radiation/quality/duration/timing/severity/associated sxs/prior Treatment) HPI  This 65 year old male presents with a 3 to four-week history of a cough was initially accompanied with Sinus symptoms with drainage and pressure. The cough gradually seemed to improve but has over the last several days worsened. It has been productive of white sputum. Patient does have a past history of smoking approximately 20 years ago but is currently not smoking. His sinus symptoms have resolved but he still continues to cough. He has had no fever or chills. He denies any shortness of breath.        Past Medical History:  Diagnosis Date  . Diabetes mellitus without complication (HCC)   . Hyperlipidemia   . Hypertension    History reviewed. No pertinent surgical history. Family History  Problem Relation Age of Onset  . Healthy Mother   . Healthy Father    Social History  Substance Use Topics  . Smoking status: Former Smoker    Quit date: 11/03/1994  . Smokeless tobacco: Never Used  . Alcohol use No    Review of Systems  Constitutional: Positive for activity change. Negative for chills, diaphoresis, fatigue and fever.  HENT: Positive for congestion, postnasal drip, rhinorrhea, sinus pain and sinus pressure.   Respiratory: Positive for cough. Negative for shortness of breath, wheezing and stridor.   All other systems reviewed and are negative.   Allergies  Patient has no known allergies.  Home Medications   Prior to Admission medications   Medication Sig Start Date End Date Taking? Authorizing Provider  glipiZIDE (GLUCOTROL) 5 MG tablet Take 1 tablet (5 mg total) by mouth daily before breakfast. 08/12/15  Yes Steele SizerMark A Crissman, MD  lisinopril (PRINIVIL,ZESTRIL) 10 MG tablet Take 1 tablet (10 mg total)  by mouth daily. 08/12/15  Yes Steele SizerMark A Crissman, MD  metFORMIN (GLUCOPHAGE) 500 MG tablet Take 1 tablet (500 mg total) by mouth 2 (two) times daily with a meal. 08/12/15  Yes Steele SizerMark A Crissman, MD  pravastatin (PRAVACHOL) 40 MG tablet Take 1 tablet (40 mg total) by mouth daily. 08/12/15  Yes Steele SizerMark A Crissman, MD  azithromycin (ZITHROMAX Z-PAK) 250 MG tablet Take per package instructions 03/01/16   Lutricia FeilWilliam P Kinzy Weyers, PA-C   Meds Ordered and Administered this Visit  Medications - No data to display  BP 119/73 (BP Location: Left Arm)   Pulse 80   Temp 97.3 F (36.3 C) (Tympanic)   Resp 16   Ht 5\' 2"  (1.575 m)   Wt 155 lb (70.3 kg)   SpO2 99%   BMI 28.35 kg/m  No data found.   Physical Exam  Constitutional: He is oriented to person, place, and time. He appears well-developed and well-nourished. No distress.  HENT:  Head: Normocephalic and atraumatic.  Right Ear: External ear normal.  Left Ear: External ear normal.  Nose: Nose normal.  Mouth/Throat: Oropharynx is clear and moist. No oropharyngeal exudate.  Eyes: EOM are normal. Pupils are equal, round, and reactive to light. Right eye exhibits no discharge. Left eye exhibits no discharge.  Neck: Normal range of motion. Neck supple.  Pulmonary/Chest: Effort normal and breath sounds normal. No respiratory distress. He has no wheezes. He has no rales.  Musculoskeletal: Normal range of motion.  Neurological: He is alert and oriented to person, place,  and time.  Skin: Skin is warm and dry. He is not diaphoretic.  Psychiatric: He has a normal mood and affect. His behavior is normal. Judgment and thought content normal.  Nursing note and vitals reviewed.   Urgent Care Course   Clinical Course     Procedures (including critical care time)  Labs Review Labs Reviewed - No data to display  Imaging Review Dg Chest 2 View  Result Date: 03/01/2016 CLINICAL DATA:  [id number] year old male with productive cough for the past 3 weeks. EXAM: CHEST  2 VIEW  COMPARISON:  No priors. FINDINGS: Lung volumes are normal. No consolidative airspace disease. No pleural effusions. No pneumothorax. No pulmonary nodule or mass noted. Pulmonary vasculature and the cardiomediastinal silhouette are within normal limits. IMPRESSION: No radiographic evidence of acute cardiopulmonary disease. Electronically Signed   By: Trudie Reedaniel  Entrikin M.D.   On: 03/01/2016 19:33     Visual Acuity Review  Right Eye Distance:   Left Eye Distance:   Bilateral Distance:    Right Eye Near:   Left Eye Near:    Bilateral Near:         MDM   1. Bronchitis    New Prescriptions   AZITHROMYCIN (ZITHROMAX Z-PAK) 250 MG TABLET    Take per package instructions  Plan: 1. Test/x-ray results and diagnosis reviewed with patient 2. rx as per orders; risks, benefits, potential side effects reviewed with patient 3. Recommend supportive treatment with Rest and fluids. Will prescribe Zithromax doesn't duration and lastly if his cough. Told him that the cough can last upwards of 6 weeks. Says he is not coughing bad at nighttime but could take over-the-counter cough syrups to help reduce the cough reflex. Continues to have problems he should follow-up with his primary care physician Dr. Dossie Arbourrissman 4. F/u prn if symptoms worsen or don't improve     Lutricia FeilWilliam P Reade Trefz, PA-C 03/01/16 1954

## 2016-03-01 NOTE — ED Triage Notes (Signed)
Patient stated he has had a productive cough for 3 weeks.

## 2016-05-30 ENCOUNTER — Encounter: Payer: Self-pay | Admitting: Family Medicine

## 2016-05-30 ENCOUNTER — Ambulatory Visit (INDEPENDENT_AMBULATORY_CARE_PROVIDER_SITE_OTHER): Payer: Medicare HMO | Admitting: Family Medicine

## 2016-05-30 VITALS — BP 132/72 | HR 79 | Ht 62.0 in | Wt 157.6 lb

## 2016-05-30 DIAGNOSIS — E785 Hyperlipidemia, unspecified: Secondary | ICD-10-CM | POA: Diagnosis not present

## 2016-05-30 DIAGNOSIS — I10 Essential (primary) hypertension: Secondary | ICD-10-CM

## 2016-05-30 DIAGNOSIS — E119 Type 2 diabetes mellitus without complications: Secondary | ICD-10-CM | POA: Diagnosis not present

## 2016-05-30 LAB — HEMOGLOBIN A1C
ESTIMATED AVERAGE GLUCOSE: 160 mg/dL
HEMOGLOBIN A1C: 7.2 % — AB (ref 4.8–5.6)

## 2016-05-30 NOTE — Assessment & Plan Note (Signed)
The current medical regimen is effective;  continue present plan and medications.  

## 2016-05-30 NOTE — Progress Notes (Signed)
BP 132/72   Pulse 79   Ht 5\' 2"  (1.575 m)   Wt 157 lb 9.6 oz (71.5 kg)   SpO2 95%   BMI 28.83 kg/m    Subjective:    Patient ID: Edward Yang, male    DOB: 1950/09/09, 66 y.o.   MRN: [id number]  HPI: Edward Yang is a 66 y.o. male  Chief Complaint  Patient presents with  . Follow-up  . Hypertension  Patient here assisted with translator. Asking about hepatitis B vaccine when over occupational risk for hepatitis B which patient does not meet any criteria decided not to get. Did discuss shingles vaccine gave patient a prescription. Reviewed blood pressure taking medications without problems good control. Reviewed diabetes patient has gained a couple of pounds has been exercising less and hemoglobin A1c is the highest is been on chart review. Cholesterol no issues  Relevant past medical, surgical, family and social history reviewed and updated as indicated. Interim medical history since our last visit reviewed. Allergies and medications reviewed and updated.  Review of Systems  Constitutional: Negative.   Respiratory: Negative.   Cardiovascular: Negative.     Per HPI unless specifically indicated above     Objective:    BP 132/72   Pulse 79   Ht 5\' 2"  (1.575 m)   Wt 157 lb 9.6 oz (71.5 kg)   SpO2 95%   BMI 28.83 kg/m   Wt Readings from Last 3 Encounters:  05/30/16 157 lb 9.6 oz (71.5 kg)  03/01/16 155 lb (70.3 kg)  02/15/16 155 lb 3.2 oz (70.4 kg)    Physical Exam  Constitutional: He is oriented to person, place, and time. He appears well-developed and well-nourished.  HENT:  Head: Normocephalic and atraumatic.  Eyes: Conjunctivae and EOM are normal.  Neck: Normal range of motion.  Cardiovascular: Normal rate, regular rhythm and normal heart sounds.   Pulmonary/Chest: Effort normal and breath sounds normal.  Musculoskeletal: Normal range of motion.  Neurological: He is alert and oriented to person, place, and time.  Skin: No erythema.  Psychiatric: He has a  normal mood and affect. His behavior is normal. Judgment and thought content normal.    Results for orders placed or performed in visit on 02/15/16  Basic metabolic panel  Result Value Ref Range   Glucose 129 (H) 65 - 99 mg/dL   BUN 14 8 - 27 mg/dL   Creatinine, Ser 4.09 0.76 - 1.27 mg/dL   GFR calc non Af Amer 81 >59 mL/min/1.73   GFR calc Af Amer 94 >59 mL/min/1.73   BUN/Creatinine Ratio 15 10 - 24   Sodium 139 134 - 144 mmol/L   Potassium 4.3 3.5 - 5.2 mmol/L   Chloride 100 96 - 106 mmol/L   CO2 25 18 - 29 mmol/L   Calcium 9.5 8.6 - 10.2 mg/dL  Bayer DCA Hb W1X Waived  Result Value Ref Range   Bayer DCA Hb A1c Waived 7.0 (H) <7.0 %  LP+ALT+AST Piccolo, Waived  Result Value Ref Range   ALT (SGPT) Piccolo, Waived 39 10 - 47 U/L   AST (SGOT) Piccolo, Waived 32 11 - 38 U/L   Cholesterol Piccolo, Waived 161 <200 mg/dL   HDL Chol Piccolo, Waived 66 >59 mg/dL   Triglycerides Piccolo,Waived 156 (H) <150 mg/dL   Chol/HDL Ratio Piccolo,Waive 2.4 mg/dL   LDL Chol Calc Piccolo Waived 64 <100 mg/dL   VLDL Chol Calc Piccolo,Waive 31 (H) <30 mg/dL      Assessment &  Plan:   Problem List Items Addressed This Visit      Cardiovascular and Mediastinum   Hypertension - Primary    The current medical regimen is effective;  continue present plan and medications.       Relevant Orders   Hemoglobin A1c     Endocrine   Diabetes mellitus without complication (HCC)    Worsening control with weight gain and poor exercise discuss lifestyle modification for better control      Relevant Orders   Hemoglobin A1c     Other   Hyperlipidemia    The current medical regimen is effective;  continue present plan and medications.       Relevant Orders   Hemoglobin A1c       Follow up plan: Return in about 3 months (around 08/30/2016) for Physical Exam, Hemoglobin A1c.

## 2016-05-30 NOTE — Assessment & Plan Note (Signed)
Worsening control with weight gain and poor exercise discuss lifestyle modification for better control

## 2016-06-21 DIAGNOSIS — Z1212 Encounter for screening for malignant neoplasm of rectum: Secondary | ICD-10-CM | POA: Diagnosis not present

## 2016-06-21 DIAGNOSIS — Z1211 Encounter for screening for malignant neoplasm of colon: Secondary | ICD-10-CM | POA: Diagnosis not present

## 2016-06-27 LAB — COLOGUARD: Cologuard: NEGATIVE

## 2016-06-28 ENCOUNTER — Telehealth: Payer: Self-pay

## 2016-06-28 NOTE — Telephone Encounter (Signed)
Pt aware.

## 2016-06-28 NOTE — Telephone Encounter (Signed)
Received fax from [facility], cologuard negative.

## 2016-07-13 ENCOUNTER — Other Ambulatory Visit: Payer: Self-pay | Admitting: Family Medicine

## 2016-07-13 DIAGNOSIS — E119 Type 2 diabetes mellitus without complications: Secondary | ICD-10-CM

## 2016-07-13 DIAGNOSIS — E785 Hyperlipidemia, unspecified: Secondary | ICD-10-CM

## 2016-07-13 DIAGNOSIS — I10 Essential (primary) hypertension: Secondary | ICD-10-CM

## 2016-08-20 ENCOUNTER — Other Ambulatory Visit: Payer: Self-pay | Admitting: Family Medicine

## 2016-08-20 DIAGNOSIS — E119 Type 2 diabetes mellitus without complications: Secondary | ICD-10-CM

## 2016-09-12 ENCOUNTER — Ambulatory Visit (INDEPENDENT_AMBULATORY_CARE_PROVIDER_SITE_OTHER): Payer: Medicare HMO | Admitting: Family Medicine

## 2016-09-12 ENCOUNTER — Encounter: Payer: Self-pay | Admitting: Family Medicine

## 2016-09-12 VITALS — BP 121/65 | HR 84 | Ht 63.39 in | Wt 152.0 lb

## 2016-09-12 DIAGNOSIS — Z1329 Encounter for screening for other suspected endocrine disorder: Secondary | ICD-10-CM | POA: Diagnosis not present

## 2016-09-12 DIAGNOSIS — Z7189 Other specified counseling: Secondary | ICD-10-CM | POA: Diagnosis not present

## 2016-09-12 DIAGNOSIS — E785 Hyperlipidemia, unspecified: Secondary | ICD-10-CM | POA: Diagnosis not present

## 2016-09-12 DIAGNOSIS — E119 Type 2 diabetes mellitus without complications: Secondary | ICD-10-CM | POA: Diagnosis not present

## 2016-09-12 DIAGNOSIS — Z125 Encounter for screening for malignant neoplasm of prostate: Secondary | ICD-10-CM

## 2016-09-12 DIAGNOSIS — E7800 Pure hypercholesterolemia, unspecified: Secondary | ICD-10-CM | POA: Diagnosis not present

## 2016-09-12 DIAGNOSIS — N400 Enlarged prostate without lower urinary tract symptoms: Secondary | ICD-10-CM | POA: Diagnosis not present

## 2016-09-12 DIAGNOSIS — I10 Essential (primary) hypertension: Secondary | ICD-10-CM

## 2016-09-12 DIAGNOSIS — Z0000 Encounter for general adult medical examination without abnormal findings: Secondary | ICD-10-CM

## 2016-09-12 LAB — URINALYSIS, ROUTINE W REFLEX MICROSCOPIC
BILIRUBIN UA: NEGATIVE
GLUCOSE, UA: NEGATIVE
Leukocytes, UA: NEGATIVE
Nitrite, UA: NEGATIVE
PROTEIN UA: NEGATIVE
RBC, UA: NEGATIVE
Specific Gravity, UA: 1.025 (ref 1.005–1.030)
UUROB: 1 mg/dL (ref 0.2–1.0)
pH, UA: 5.5 (ref 5.0–7.5)

## 2016-09-12 LAB — BAYER DCA HB A1C WAIVED: HB A1C: 6.3 % (ref <7.0)

## 2016-09-12 MED ORDER — LISINOPRIL 10 MG PO TABS: 10.0000 mg | ORAL_TABLET | Freq: Every day | ORAL | 12 refills | Status: DC

## 2016-09-12 MED ORDER — GLIPIZIDE 5 MG PO TABS: 5.0000 mg | ORAL_TABLET | Freq: Every day | ORAL | 12 refills | Status: DC

## 2016-09-12 MED ORDER — METFORMIN HCL 500 MG PO TABS: 500.0000 mg | ORAL_TABLET | Freq: Two times a day (BID) | ORAL | 12 refills | Status: DC

## 2016-09-12 MED ORDER — PRAVASTATIN SODIUM 40 MG PO TABS: 40.0000 mg | ORAL_TABLET | Freq: Every day | ORAL | 12 refills | Status: DC

## 2016-09-12 NOTE — Assessment & Plan Note (Signed)
A voluntary discussion about advance care planning including the explanation and discussion of advance directives was extensively discussed  with the patient.  Explanation about the health care proxy and Living will was reviewed and packet with forms with explanation of how to fill them out was given.    

## 2016-09-12 NOTE — Assessment & Plan Note (Signed)
Mild BPH no sx

## 2016-09-12 NOTE — Progress Notes (Signed)
BP 121/65   Pulse 84   Ht 5' 3.39" (1.61 m)   Wt 152 lb (68.9 kg)   SpO2 98%   BMI 26.60 kg/m    Subjective:    Patient ID: Edward Yang, male    DOB: [birth year], 66 y.o.   MRN: [id number]  HPI: Edward Yang is a 66 y.o. male  Chief Complaint  Patient presents with  . Annual Exam  Patient doing well with diabetes no concerns or issues noted low blood pressure blood sugar spells. Takes medications faithfully without side effects. Cholesterol also doing well without problems.  Relevant past medical, surgical, family and social history reviewed and updated as indicated. Interim medical history since our last visit reviewed. Allergies and medications reviewed and updated.  Review of Systems  Constitutional: Negative.   HENT: Negative.   Eyes: Negative.   Respiratory: Negative.   Cardiovascular: Negative.   Gastrointestinal: Negative.   Endocrine: Negative.   Genitourinary: Negative.   Musculoskeletal: Negative.   Skin: Negative.   Allergic/Immunologic: Negative.   Neurological: Negative.   Hematological: Negative.   Psychiatric/Behavioral: Negative.     Per HPI unless specifically indicated above     Objective:    BP 121/65   Pulse 84   Ht 5' 3.39" (1.61 m)   Wt 152 lb (68.9 kg)   SpO2 98%   BMI 26.60 kg/m   Wt Readings from Last 3 Encounters:  09/12/16 152 lb (68.9 kg)  05/30/16 157 lb 9.6 oz (71.5 kg)  03/01/16 155 lb (70.3 kg)    Physical Exam  Constitutional: He is oriented to person, place, and time. He appears well-developed and well-nourished.  HENT:  Head: Normocephalic and atraumatic.  Right Ear: External ear normal.  Left Ear: External ear normal.  Eyes: Conjunctivae and EOM are normal. Pupils are equal, round, and reactive to light.  Neck: Normal range of motion. Neck supple.  Cardiovascular: Normal rate, regular rhythm, normal heart sounds and intact distal pulses.   Pulmonary/Chest: Effort normal and breath sounds normal.  Abdominal: Soft.  Bowel sounds are normal. There is no splenomegaly or hepatomegaly.  Genitourinary: Rectum normal and penis normal.  Genitourinary Comments: BPH changes  Musculoskeletal: Normal range of motion.  Neurological: He is alert and oriented to person, place, and time. He has normal reflexes.  Skin: No rash noted. No erythema.  Psychiatric: He has a normal mood and affect. His behavior is normal. Judgment and thought content normal.    Results for orders placed or performed in visit on 06/28/16  Cologuard  Result Value Ref Range   Cologuard Negative       Assessment & Plan:   Problem List Items Addressed This Visit      Cardiovascular and Mediastinum   Hypertension    The current medical regimen is effective;  continue present plan and medications.       Relevant Medications   lisinopril (PRINIVIL,ZESTRIL) 10 MG tablet   pravastatin (PRAVACHOL) 40 MG tablet   Other Relevant Orders   Bayer DCA Hb A1c Waived   CBC with Differential/Platelet     Endocrine   Diabetes mellitus without complication (HCC)    The current medical regimen is effective;  continue present plan and medications.       Relevant Medications   glipiZIDE (GLUCOTROL) 5 MG tablet   lisinopril (PRINIVIL,ZESTRIL) 10 MG tablet   metFORMIN (GLUCOPHAGE) 500 MG tablet   pravastatin (PRAVACHOL) 40 MG tablet   Other Relevant Orders   Bayer  DCA Hb A1c Waived   Comprehensive metabolic panel   Urinalysis, Routine w reflex microscopic     Genitourinary   BPH (benign prostatic hyperplasia)    Mild BPH no sx        Other   Hyperlipidemia    The current medical regimen is effective;  continue present plan and medications.       Relevant Medications   lisinopril (PRINIVIL,ZESTRIL) 10 MG tablet   pravastatin (PRAVACHOL) 40 MG tablet   Other Relevant Orders   Bayer DCA Hb A1c Waived   Lipid panel   Advanced care planning/counseling discussion    A voluntary discussion about advance care planning including the  explanation and discussion of advance directives was extensively discussed  with the patient.  Explanation about the health care proxy and Living will was reviewed and packet with forms with explanation of how to fill them out was given.          Other Visit Diagnoses    Annual physical exam    -  Primary   Prostate cancer screening       Relevant Orders   PSA   Thyroid disorder screen       Relevant Orders   TSH       Follow up plan: Return for Hemoglobin A1c.

## 2016-09-12 NOTE — Assessment & Plan Note (Signed)
The current medical regimen is effective;  continue present plan and medications.  

## 2016-09-13 ENCOUNTER — Encounter: Payer: Self-pay | Admitting: Family Medicine

## 2016-09-13 LAB — COMPREHENSIVE METABOLIC PANEL
A/G RATIO: 2 (ref 1.2–2.2)
ALT: 25 IU/L (ref 0–44)
AST: 25 IU/L (ref 0–40)
Albumin: 4.7 g/dL (ref 3.6–4.8)
Alkaline Phosphatase: 89 IU/L (ref 39–117)
BUN / CREAT RATIO: 14 (ref 10–24)
BUN: 19 mg/dL (ref 8–27)
Bilirubin Total: 0.6 mg/dL (ref 0.0–1.2)
CALCIUM: 9.6 mg/dL (ref 8.6–10.2)
CO2: 24 mmol/L (ref 20–29)
Chloride: 101 mmol/L (ref 96–106)
Creatinine, Ser: 1.33 mg/dL — ABNORMAL HIGH (ref 0.76–1.27)
GFR, EST AFRICAN AMERICAN: 63 mL/min/{1.73_m2} (ref >59)
GFR, EST NON AFRICAN AMERICAN: 55 mL/min/{1.73_m2} — AB (ref >59)
GLOBULIN, TOTAL: 2.3 g/dL (ref 1.5–4.5)
Glucose: 160 mg/dL — ABNORMAL HIGH (ref 65–99)
POTASSIUM: 4.7 mmol/L (ref 3.5–5.2)
SODIUM: 141 mmol/L (ref 134–144)
TOTAL PROTEIN: 7 g/dL (ref 6.0–8.5)

## 2016-09-13 LAB — CBC WITH DIFFERENTIAL/PLATELET
BASOS: 0 %
Basophils Absolute: 0 10*3/uL (ref 0.0–0.2)
EOS (ABSOLUTE): 0.1 10*3/uL (ref 0.0–0.4)
EOS: 1 %
HEMATOCRIT: 44.1 % (ref 37.5–51.0)
Hemoglobin: 14.4 g/dL (ref 13.0–17.7)
IMMATURE GRANS (ABS): 0 10*3/uL (ref 0.0–0.1)
IMMATURE GRANULOCYTES: 0 %
Lymphocytes Absolute: 2.6 10*3/uL (ref 0.7–3.1)
Lymphs: 29 %
MCH: 31.6 pg (ref 26.6–33.0)
MCHC: 32.7 g/dL (ref 31.5–35.7)
MCV: 97 fL (ref 79–97)
MONOS ABS: 0.4 10*3/uL (ref 0.1–0.9)
Monocytes: 4 %
NEUTROS PCT: 66 %
Neutrophils Absolute: 6.1 10*3/uL (ref 1.4–7.0)
Platelets: 142 10*3/uL — ABNORMAL LOW (ref 150–379)
RBC: 4.56 x10E6/uL (ref 4.14–5.80)
RDW: 13 % (ref 12.3–15.4)
WBC: 9.2 10*3/uL (ref 3.4–10.8)

## 2016-09-13 LAB — PSA: PROSTATE SPECIFIC AG, SERUM: 0.7 ng/mL (ref 0.0–4.0)

## 2016-09-13 LAB — LIPID PANEL
CHOL/HDL RATIO: 2.5 ratio (ref 0.0–5.0)
Cholesterol, Total: 159 mg/dL (ref 100–199)
HDL: 63 mg/dL (ref >39)
LDL Calculated: 58 mg/dL (ref 0–99)
Triglycerides: 192 mg/dL — ABNORMAL HIGH (ref 0–149)
VLDL Cholesterol Cal: 38 mg/dL (ref 5–40)

## 2016-09-13 LAB — TSH: TSH: 1.04 u[IU]/mL (ref 0.450–4.500)

## 2016-12-19 ENCOUNTER — Encounter: Payer: Self-pay | Admitting: Family Medicine

## 2016-12-19 ENCOUNTER — Ambulatory Visit (INDEPENDENT_AMBULATORY_CARE_PROVIDER_SITE_OTHER): Payer: Medicare HMO | Admitting: Family Medicine

## 2016-12-19 ENCOUNTER — Ambulatory Visit: Payer: Medicare HMO | Admitting: Family Medicine

## 2016-12-19 DIAGNOSIS — I10 Essential (primary) hypertension: Secondary | ICD-10-CM | POA: Diagnosis not present

## 2016-12-19 DIAGNOSIS — E785 Hyperlipidemia, unspecified: Secondary | ICD-10-CM

## 2016-12-19 DIAGNOSIS — E119 Type 2 diabetes mellitus without complications: Secondary | ICD-10-CM | POA: Diagnosis not present

## 2016-12-19 NOTE — Assessment & Plan Note (Signed)
Apparently good control with no complaints A1c pending at this state.

## 2016-12-19 NOTE — Assessment & Plan Note (Signed)
The current medical regimen is effective;  continue present plan and medications.  

## 2016-12-19 NOTE — Progress Notes (Signed)
BP 135/75   Pulse 80   Wt 152 lb (68.9 kg)   SpO2 95%   BMI 26.60 kg/m    Subjective:    Patient ID: Edward Yang, male    DOB: Aug 17, 1950, 66 y.o.   MRN: [id number]  HPI: Edward Yang is a 66 y.o. male  Chief Complaint  Patient presents with  . Follow-up  [phone] [id number]   Relevant past medical, surgical, family and social history reviewed and updated as indicated. Interim medical history since our last visit reviewed. Allergies and medications reviewed and updated.  Review of Systems  Constitutional: Negative.   Respiratory: Negative.   Cardiovascular: Negative.     Per HPI unless specifically indicated above     Objective:    BP 135/75   Pulse 80   Wt 152 lb (68.9 kg)   SpO2 95%   BMI 26.60 kg/m   Wt Readings from Last 3 Encounters:  12/19/16 152 lb (68.9 kg)  09/12/16 152 lb (68.9 kg)  05/30/16 157 lb 9.6 oz (71.5 kg)    Physical Exam  Constitutional: He is oriented to person, place, and time. He appears well-developed and well-nourished.  HENT:  Head: Normocephalic and atraumatic.  Eyes: Conjunctivae and EOM are normal.  Neck: Normal range of motion.  Cardiovascular: Normal rate, regular rhythm and normal heart sounds.   Pulmonary/Chest: Effort normal and breath sounds normal.  Musculoskeletal: Normal range of motion.  Neurological: He is alert and oriented to person, place, and time.  Skin: No erythema.  Psychiatric: He has a normal mood and affect. His behavior is normal. Judgment and thought content normal.    Results for orders placed or performed in visit on 09/12/16  Bayer DCA Hb A1c Waived  Result Value Ref Range   Bayer DCA Hb A1c Waived 6.3 <7.0 %  CBC with Differential/Platelet  Result Value Ref Range   WBC 9.2 3.4 - 10.8 x10E3/uL   RBC 4.56 4.14 - 5.80 x10E6/uL   Hemoglobin 14.4 13.0 - 17.7 g/dL   Hematocrit 19.1 47.8 - 51.0 %   MCV 97 79 - 97 fL   MCH 31.6 26.6 - 33.0 pg   MCHC 32.7 31.5 - 35.7 g/dL   RDW 29.5 62.1 - 30.8 %   Platelets 142 (L) 150 - 379 x10E3/uL   Neutrophils 66 Not Estab. %   Lymphs 29 Not Estab. %   Monocytes 4 Not Estab. %   Eos 1 Not Estab. %   Basos 0 Not Estab. %   Neutrophils Absolute 6.1 1.4 - 7.0 x10E3/uL   Lymphocytes Absolute 2.6 0.7 - 3.1 x10E3/uL   Monocytes Absolute 0.4 0.1 - 0.9 x10E3/uL   EOS (ABSOLUTE) 0.1 0.0 - 0.4 x10E3/uL   Basophils Absolute 0.0 0.0 - 0.2 x10E3/uL   Immature Granulocytes 0 Not Estab. %   Immature Grans (Abs) 0.0 0.0 - 0.1 x10E3/uL  Comprehensive metabolic panel  Result Value Ref Range   Glucose 160 (H) 65 - 99 mg/dL   BUN 19 8 - 27 mg/dL   Creatinine, Ser 6.57 (H) 0.76 - 1.27 mg/dL   GFR calc non Af Amer 55 (L) >59 mL/min/1.73   GFR calc Af Amer 63 >59 mL/min/1.73   BUN/Creatinine Ratio 14 10 - 24   Sodium 141 134 - 144 mmol/L   Potassium 4.7 3.5 - 5.2 mmol/L   Chloride 101 96 - 106 mmol/L   CO2 24 20 - 29 mmol/L   Calcium 9.6 8.6 - 10.2 mg/dL  Total Protein 7.0 6.0 - 8.5 g/dL   Albumin 4.7 3.6 - 4.8 g/dL   Globulin, Total 2.3 1.5 - 4.5 g/dL   Albumin/Globulin Ratio 2.0 1.2 - 2.2   Bilirubin Total 0.6 0.0 - 1.2 mg/dL   Alkaline Phosphatase 89 39 - 117 IU/L   AST 25 0 - 40 IU/L   ALT 25 0 - 44 IU/L  Lipid panel  Result Value Ref Range   Cholesterol, Total 159 100 - 199 mg/dL   Triglycerides 161 (H) 0 - 149 mg/dL   HDL 63 >09 mg/dL   VLDL Cholesterol Cal 38 5 - 40 mg/dL   LDL Calculated 58 0 - 99 mg/dL   Chol/HDL Ratio 2.5 0.0 - 5.0 ratio  PSA  Result Value Ref Range   Prostate Specific Ag, Serum 0.7 0.0 - 4.0 ng/mL  TSH  Result Value Ref Range   TSH 1.040 0.450 - 4.500 uIU/mL  Urinalysis, Routine w reflex microscopic  Result Value Ref Range   Specific Gravity, UA 1.025 1.005 - 1.030   pH, UA 5.5 5.0 - 7.5   Color, UA Yellow Yellow   Appearance Ur Clear Clear   Leukocytes, UA Negative Negative   Protein, UA Negative Negative/Trace   Glucose, UA Negative Negative   Ketones, UA Trace (A) Negative   RBC, UA Negative Negative    Bilirubin, UA Negative Negative   Urobilinogen, Ur 1.0 0.2 - 1.0 mg/dL   Nitrite, UA Negative Negative      Assessment & Plan:   Problem List Items Addressed This Visit      Cardiovascular and Mediastinum   Hypertension    The current medical regimen is effective;  continue present plan and medications.         Endocrine   Diabetes mellitus without complication (HCC)    Apparently good control with no complaints A1c pending at this state.      Relevant Orders   Bayer DCA Hb A1c Waived     Other   Hyperlipidemia    The current medical regimen is effective;  continue present plan and medications.           Follow up plan: Return in about 3 months (around 03/21/2017) for Hemoglobin A1c, BMP,  Lipids, ALT, AST.

## 2016-12-21 ENCOUNTER — Other Ambulatory Visit: Payer: Medicare HMO

## 2016-12-21 ENCOUNTER — Ambulatory Visit (INDEPENDENT_AMBULATORY_CARE_PROVIDER_SITE_OTHER): Payer: Medicare HMO

## 2016-12-21 DIAGNOSIS — E119 Type 2 diabetes mellitus without complications: Secondary | ICD-10-CM | POA: Diagnosis not present

## 2016-12-21 DIAGNOSIS — Z23 Encounter for immunization: Secondary | ICD-10-CM | POA: Diagnosis not present

## 2016-12-21 LAB — BAYER DCA HB A1C WAIVED: HB A1C (BAYER DCA - WAIVED): 6.6 % (ref <7.0)

## 2016-12-22 ENCOUNTER — Telehealth: Payer: Self-pay | Admitting: Family Medicine

## 2016-12-22 NOTE — Telephone Encounter (Signed)
Labs printed and placed in out-going mail.

## 2016-12-22 NOTE — Telephone Encounter (Signed)
Patient requested a copy of his labs be sent to his home address please.  Thank you

## 2017-03-20 ENCOUNTER — Encounter: Payer: Self-pay | Admitting: Family Medicine

## 2017-03-20 ENCOUNTER — Ambulatory Visit: Payer: Medicare HMO | Admitting: Family Medicine

## 2017-03-20 VITALS — BP 132/70 | HR 82 | Wt 154.0 lb

## 2017-03-20 DIAGNOSIS — E7800 Pure hypercholesterolemia, unspecified: Secondary | ICD-10-CM | POA: Diagnosis not present

## 2017-03-20 DIAGNOSIS — E785 Hyperlipidemia, unspecified: Secondary | ICD-10-CM

## 2017-03-20 DIAGNOSIS — I10 Essential (primary) hypertension: Secondary | ICD-10-CM | POA: Diagnosis not present

## 2017-03-20 DIAGNOSIS — E119 Type 2 diabetes mellitus without complications: Secondary | ICD-10-CM

## 2017-03-20 LAB — LP+ALT+AST PICCOLO, WAIVED
ALT (SGPT) PICCOLO, WAIVED: 40 U/L (ref 10–47)
AST (SGOT) Piccolo, Waived: 26 U/L (ref 11–38)
CHOL/HDL RATIO PICCOLO,WAIVE: 2.8 mg/dL
Cholesterol Piccolo, Waived: 175 mg/dL (ref <200)
HDL CHOL PICCOLO, WAIVED: 64 mg/dL (ref >59)
LDL Chol Calc Piccolo Waived: 76 mg/dL (ref <100)
TRIGLYCERIDES PICCOLO,WAIVED: 180 mg/dL — AB (ref <150)
VLDL CHOL CALC PICCOLO,WAIVE: 36 mg/dL — AB (ref <30)

## 2017-03-20 LAB — BAYER DCA HB A1C WAIVED: HB A1C (BAYER DCA - WAIVED): 7 % — ABNORMAL HIGH (ref <7.0)

## 2017-03-20 MED ORDER — PRAVASTATIN SODIUM 40 MG PO TABS: 40.0000 mg | ORAL_TABLET | Freq: Every day | ORAL | 12 refills | Status: DC

## 2017-03-20 MED ORDER — GLIPIZIDE 5 MG PO TABS: 5.0000 mg | ORAL_TABLET | Freq: Every day | ORAL | 12 refills | Status: DC

## 2017-03-20 MED ORDER — LISINOPRIL 10 MG PO TABS: 10.0000 mg | ORAL_TABLET | Freq: Every day | ORAL | 12 refills | Status: DC

## 2017-03-20 MED ORDER — METFORMIN HCL 500 MG PO TABS: 500.0000 mg | ORAL_TABLET | Freq: Two times a day (BID) | ORAL | 12 refills | Status: DC

## 2017-03-20 NOTE — Assessment & Plan Note (Signed)
The current medical regimen is effective;  continue present plan and medications. Discuss need for some improvement which patient is doing with better diet neck size

## 2017-03-20 NOTE — Assessment & Plan Note (Signed)
The current medical regimen is effective;  continue present plan and medications. Discuss need for some improvement which patient is doing with diet and exercise

## 2017-03-20 NOTE — Progress Notes (Signed)
BP 132/70 (BP Location: Left Arm)   Pulse 82   Wt 154 lb (69.9 kg)   SpO2 98%   BMI 26.95 kg/m    Subjective:    Patient ID: Edward Yang, male    DOB: 12/01/1950, 67 y.o.   MRN: [id number]  HPI: Edward Yang is a 67 y.o. male  Chief Complaint  Patient presents with  . Follow-up  . Cough  patient follow-up had cold 2 weeks ago still has some residual cough but otherwise feels okay. Diabetes doing well except for usualwintertime holiday increase in food and decrease in exercise Blood pressure cholesterolstable with no issues.  Relevant past medical, surgical, family and social history reviewed and updated as indicated. Interim medical history since our last visit reviewed. Allergies and medications reviewed and updated.  Review of Systems  Constitutional: Negative.   Respiratory: Negative.   Cardiovascular: Negative.     Per HPI unless specifically indicated above     Objective:    BP 132/70 (BP Location: Left Arm)   Pulse 82   Wt 154 lb (69.9 kg)   SpO2 98%   BMI 26.95 kg/m   Wt Readings from Last 3 Encounters:  03/20/17 154 lb (69.9 kg)  12/19/16 152 lb (68.9 kg)  09/12/16 152 lb (68.9 kg)    Physical Exam  Constitutional: He is oriented to person, place, and time. He appears well-developed and well-nourished.  HENT:  Head: Normocephalic and atraumatic.  Eyes: Conjunctivae and EOM are normal.  Neck: Normal range of motion.  Cardiovascular: Normal rate, regular rhythm and normal heart sounds.  Pulmonary/Chest: Effort normal and breath sounds normal.  Musculoskeletal: Normal range of motion.  Neurological: He is alert and oriented to person, place, and time.  Skin: No erythema.  Psychiatric: He has a normal mood and affect. His behavior is normal. Judgment and thought content normal.    Results for orders placed or performed in visit on 12/21/16  Bayer DCA Hb A1c Waived  Result Value Ref Range   Bayer DCA Hb A1c Waived 6.6 <7.0 %      Assessment &  Plan:   Problem List Items Addressed This Visit      Cardiovascular and Mediastinum   Hypertension - Primary    The current medical regimen is effective;  continue present plan and medications.       Relevant Medications   lisinopril (PRINIVIL,ZESTRIL) 10 MG tablet   pravastatin (PRAVACHOL) 40 MG tablet   Other Relevant Orders   Bayer DCA Hb A1c Waived   Basic metabolic panel   LP+ALT+AST Piccolo, Waived     Endocrine   Diabetes mellitus without complication (HCC)    The current medical regimen is effective;  continue present plan and medications. Discuss need for some improvement which patient is doing with better diet neck size      Relevant Medications   glipiZIDE (GLUCOTROL) 5 MG tablet   lisinopril (PRINIVIL,ZESTRIL) 10 MG tablet   metFORMIN (GLUCOPHAGE) 500 MG tablet   pravastatin (PRAVACHOL) 40 MG tablet   Other Relevant Orders   Bayer DCA Hb A1c Waived   Basic metabolic panel   LP+ALT+AST Piccolo, Waived     Other   Hyperlipidemia    The current medical regimen is effective;  continue present plan and medications. Discuss need for some improvement which patient is doing with diet and exercise      Relevant Medications   lisinopril (PRINIVIL,ZESTRIL) 10 MG tablet   pravastatin (PRAVACHOL) 40 MG tablet  Other Relevant Orders   Bayer DCA Hb A1c Waived   Basic metabolic panel   LP+ALT+AST Piccolo, Waived       Follow up plan: Return in about 3 months (around 06/18/2017) for Hemoglobin A1c.

## 2017-03-20 NOTE — Assessment & Plan Note (Signed)
The current medical regimen is effective;  continue present plan and medications.  

## 2017-03-21 ENCOUNTER — Encounter: Payer: Self-pay | Admitting: Family Medicine

## 2017-03-21 LAB — BASIC METABOLIC PANEL
BUN / CREAT RATIO: 12 (ref 10–24)
BUN: 12 mg/dL (ref 8–27)
CHLORIDE: 102 mmol/L (ref 96–106)
CO2: 25 mmol/L (ref 20–29)
CREATININE: 1.01 mg/dL (ref 0.76–1.27)
Calcium: 9.4 mg/dL (ref 8.6–10.2)
GFR calc Af Amer: 88 mL/min/{1.73_m2} (ref >59)
GFR calc non Af Amer: 76 mL/min/{1.73_m2} (ref >59)
GLUCOSE: 127 mg/dL — AB (ref 65–99)
Potassium: 3.9 mmol/L (ref 3.5–5.2)
SODIUM: 140 mmol/L (ref 134–144)

## 2017-03-27 ENCOUNTER — Ambulatory Visit: Payer: Medicare HMO

## 2017-03-27 ENCOUNTER — Ambulatory Visit (INDEPENDENT_AMBULATORY_CARE_PROVIDER_SITE_OTHER): Payer: Medicare HMO

## 2017-03-27 VITALS — BP 132/66 | HR 82 | Temp 98.1°F | Ht 63.0 in | Wt 156.3 lb

## 2017-03-27 DIAGNOSIS — Z0000 Encounter for general adult medical examination without abnormal findings: Secondary | ICD-10-CM | POA: Diagnosis not present

## 2017-03-27 NOTE — Patient Instructions (Addendum)
[name] , Thank you for taking time to come for your Medicare Wellness Visit. I appreciate your ongoing commitment to your health goals. Please review the following plan we discussed and let me know if I can assist you in the future.   Screening recommendations/referrals: Colonoscopy: completed 06/27/2016, due 06/2019 Recommended yearly ophthalmology/optometry visit for glaucoma screening and checkup Recommended yearly dental visit for hygiene and checkup  Vaccinations: Influenza vaccine: up to date  Pneumococcal vaccine: up to date  Tdap vaccine: up to date  Shingles vaccine: due, check with your insurance company for coverage   Advanced directives: Advance directive discussed with you today. I have provided a copy for you to complete at home and have notarized. Once this is complete please bring a copy in to our office so we can scan it into your chart.  Conditions/risks identified: Recommend drinking at least 6-8 glasses of water a day  Next appointment: Follow up in one year for your annual wellness exam.   Preventive Care 65 Years and Older, Male Preventive care refers to lifestyle choices and visits with your health care provider that can promote health and wellness. What does preventive care include?  A yearly physical exam. This is also called an annual well check.  Dental exams once or twice a year.  Routine eye exams. Ask your health care provider how often you should have your eyes checked.  Personal lifestyle choices, including:  Daily care of your teeth and gums.  Regular physical activity.  Eating a healthy diet.  Avoiding tobacco and drug use.  Limiting alcohol use.  Practicing safe sex.  Taking low doses of aspirin every day.  Taking vitamin and mineral supplements as recommended by your health care provider. What happens during an annual well check? The services and screenings done by your health care provider during your annual well check will depend on  your age, overall health, lifestyle risk factors, and family history of disease. Counseling  Your health care provider may ask you questions about your:  Alcohol use.  Tobacco use.  Drug use.  Emotional well-being.  Home and relationship well-being.  Sexual activity.  Eating habits.  History of falls.  Memory and ability to understand (cognition).  Work and work Astronomerenvironment. Screening  You may have the following tests or measurements:  Height, weight, and BMI.  Blood pressure.  Lipid and cholesterol levels. These may be checked every 5 years, or more frequently if you are over 712 years old.  Skin check.  Lung cancer screening. You may have this screening every year starting at age 69 if you have a 30-pack-year history of smoking and currently smoke or have quit within the past 15 years.  Fecal occult blood test (FOBT) of the stool. You may have this test every year starting at age 69.  Flexible sigmoidoscopy or colonoscopy. You may have a sigmoidoscopy every 5 years or a colonoscopy every 10 years starting at age 69.  Prostate cancer screening. Recommendations will vary depending on your family history and other risks.  Hepatitis C blood test.  Hepatitis B blood test.  Sexually transmitted disease (STD) testing.  Diabetes screening. This is done by checking your blood sugar (glucose) after you have not eaten for a while (fasting). You may have this done every 1-3 years.  Abdominal aortic aneurysm (AAA) screening. You may need this if you are a current or former smoker.  Osteoporosis. You may be screened starting at age 69 if you are at high risk.  Talk with your health care provider about your test results, treatment options, and if necessary, the need for more tests. Vaccines  Your health care provider may recommend certain vaccines, such as:  Influenza vaccine. This is recommended every year.  Tetanus, diphtheria, and acellular pertussis (Tdap, Td) vaccine.  You may need a Td booster every 10 years.  Zoster vaccine. You may need this after age 83.  Pneumococcal 13-valent conjugate (PCV13) vaccine. One dose is recommended after age 42.  Pneumococcal polysaccharide (PPSV23) vaccine. One dose is recommended after age 66. Talk to your health care provider about which screenings and vaccines you need and how often you need them. This information is not intended to replace advice given to you by your health care provider. Make sure you discuss any questions you have with your health care provider. Document Released: 03/20/2015 Document Revised: 11/11/2015 Document Reviewed: 12/23/2014 Elsevier Interactive Patient Education  2017 [place] Prevention in the Home Falls can cause injuries. They can happen to people of all ages. There are many things you can do to make your home safe and to help prevent falls. What can I do on the outside of my home?  Regularly fix the edges of walkways and driveways and fix any cracks.  Remove anything that might make you trip as you walk through a door, such as a raised step or threshold.  Trim any bushes or trees on the path to your home.  Use bright outdoor lighting.  Clear any walking paths of anything that might make someone trip, such as rocks or tools.  Regularly check to see if handrails are loose or broken. Make sure that both sides of any steps have handrails.  Any raised decks and porches should have guardrails on the edges.  Have any leaves, snow, or ice cleared regularly.  Use sand or salt on walking paths during winter.  Clean up any spills in your garage right away. This includes oil or grease spills. What can I do in the bathroom?  Use night lights.  Install grab bars by the toilet and in the tub and shower. Do not use towel bars as grab bars.  Use non-skid mats or decals in the tub or shower.  If you need to sit down in the shower, use a plastic, non-slip stool.  Keep the  floor dry. Clean up any water that spills on the floor as soon as it happens.  Remove soap buildup in the tub or shower regularly.  Attach bath mats securely with double-sided non-slip rug tape.  Do not have throw rugs and other things on the floor that can make you trip. What can I do in the bedroom?  Use night lights.  Make sure that you have a light by your bed that is easy to reach.  Do not use any sheets or blankets that are too big for your bed. They should not hang down onto the floor.  Have a firm chair that has side arms. You can use this for support while you get dressed.  Do not have throw rugs and other things on the floor that can make you trip. What can I do in the kitchen?  Clean up any spills right away.  Avoid walking on wet floors.  Keep items that you use a lot in easy-to-reach places.  If you need to reach something above you, use a strong step stool that has a grab bar.  Keep electrical cords out of the way.  Do not use floor polish or wax that makes floors slippery. If you must use wax, use non-skid floor wax.  Do not have throw rugs and other things on the floor that can make you trip. What can I do with my stairs?  Do not leave any items on the stairs.  Make sure that there are handrails on both sides of the stairs and use them. Fix handrails that are broken or loose. Make sure that handrails are as long as the stairways.  Check any carpeting to make sure that it is firmly attached to the stairs. Fix any carpet that is loose or worn.  Avoid having throw rugs at the top or bottom of the stairs. If you do have throw rugs, attach them to the floor with carpet tape.  Make sure that you have a light switch at the top of the stairs and the bottom of the stairs. If you do not have them, ask someone to add them for you. What else can I do to help prevent falls?  Wear shoes that:  Do not have high heels.  Have rubber bottoms.  Are comfortable and fit  you well.  Are closed at the toe. Do not wear sandals.  If you use a stepladder:  Make sure that it is fully opened. Do not climb a closed stepladder.  Make sure that both sides of the stepladder are locked into place.  Ask someone to hold it for you, if possible.  Clearly mark and make sure that you can see:  Any grab bars or handrails.  First and last steps.  Where the edge of each step is.  Use tools that help you move around (mobility aids) if they are needed. These include:  Canes.  Walkers.  Scooters.  Crutches.  Turn on the lights when you go into a dark area. Replace any light bulbs as soon as they burn out.  Set up your furniture so you have a clear path. Avoid moving your furniture around.  If any of your floors are uneven, fix them.  If there are any pets around you, be aware of where they are.  Review your medicines with your doctor. Some medicines can make you feel dizzy. This can increase your chance of falling. Ask your doctor what other things that you can do to help prevent falls. This information is not intended to replace advice given to you by your health care provider. Make sure you discuss any questions you have with your health care provider. Document Released: 12/18/2008 Document Revised: 07/30/2015 Document Reviewed: 03/28/2014 Elsevier Interactive Patient Education  2017 [employer].

## 2017-03-27 NOTE — Progress Notes (Signed)
Subjective:   Edward Yang is a 67 y.o. male who presents for Medicare Annual/Subsequent preventive examination.  Review of Systems:   Cardiac Risk Factors include: hypertension;male gender;dyslipidemia;diabetes mellitus;advanced age (>21men, >16 women)     Objective:    Vitals: BP 132/66 (BP Location: Left Arm, Patient Position: Sitting)   Pulse 82   Temp 98.1 F (36.7 C) (Temporal)   Ht 5\' 3"  (1.6 m)   Wt 156 lb 4.8 oz (70.9 kg)   BMI 27.69 kg/m   Body mass index is 27.69 kg/m.  No flowsheet data found.  Tobacco Social History   Tobacco Use  Smoking Status Former Smoker  . Last attempt to quit: 11/03/1994  . Years since quitting: 22.4  Smokeless Tobacco Never Used     Counseling given: Not Answered   Clinical Intake:  Pre-visit preparation completed: Yes  Pain : No/denies pain Pain Score: 0-No pain     Nutritional Status: BMI 25 -29 Overweight Nutritional Risks: None Diabetes: Yes CBG done?: No Did pt. bring in CBG monitor from home?: No  How often do you need to have someone help you when you read instructions, pamphlets, or other written materials from your doctor or pharmacy?: 1 - Never What is the last grade level you completed in school?: college   Interpreter Needed?: Yes Interpreter Agency: [employer] Interpreter Name: Brantley Stage Patient Declined Interpreter : No Patient signed Bootjack waiver: Yes  Information entered by :: Jeremyah Jelley,LPN  Past Medical History:  Diagnosis Date  . Diabetes mellitus without complication (HCC)   . Hyperlipidemia   . Hypertension    History reviewed. No pertinent surgical history. Family History  Problem Relation Age of Onset  . Healthy Mother   . Healthy Father    Social History   Socioeconomic History  . Marital status: Married    Spouse name: None  . Number of children: None  . Years of education: None  . Highest education level: None  Social Needs  . Financial resource  strain: Not hard at all  . Food insecurity - worry: Never true  . Food insecurity - inability: Never true  . Transportation needs - medical: No  . Transportation needs - non-medical: No  Occupational History  . None  Tobacco Use  . Smoking status: Former Smoker    Last attempt to quit: 11/03/1994    Years since quitting: 22.4  . Smokeless tobacco: Never Used  Substance and Sexual Activity  . Alcohol use: No    Alcohol/week: 0.0 oz  . Drug use: No  . Sexual activity: None  Other Topics Concern  . None  Social History Narrative  . None    Outpatient Encounter Medications as of 03/27/2017  Medication Sig  . glipiZIDE (GLUCOTROL) 5 MG tablet Take 1 tablet (5 mg total) by mouth daily before breakfast.  . lisinopril (PRINIVIL,ZESTRIL) 10 MG tablet Take 1 tablet (10 mg total) by mouth daily.  . metFORMIN (GLUCOPHAGE) 500 MG tablet Take 1 tablet (500 mg total) by mouth 2 (two) times daily with a meal.  . pravastatin (PRAVACHOL) 40 MG tablet Take 1 tablet (40 mg total) by mouth daily.   No facility-administered encounter medications on file as of 03/27/2017.     Activities of Daily Living In your present state of health, do you have any difficulty performing the following activities: 03/27/2017  Hearing? N  Vision? N  Difficulty concentrating or making decisions? N  Walking or climbing stairs? N  Dressing or  bathing? N  Doing errands, shopping? N  Preparing Food and eating ? N  Using the Toilet? N  In the past six months, have you accidently leaked urine? N  Do you have problems with loss of bowel control? N  Managing your Medications? N  Managing your Finances? N  Housekeeping or managing your Housekeeping? N  Some recent data might be hidden    Patient Care Team: Steele Sizer, MD as PCP - General (Family Medicine)   Assessment:   This is a routine wellness examination for Edward Yang.  Exercise Activities and Dietary recommendations Current Exercise Habits: Structured  exercise class, Type of exercise: strength training/weights(swimming ), Time (Minutes): 60, Frequency (Times/Week): 5, Weekly Exercise (Minutes/Week): 300, Intensity: Moderate, Exercise limited by: None identified  Goals    . DIET - INCREASE WATER INTAKE     Recommend drinking at least 6-8 glasses of water a day       Fall Risk Fall Risk  03/27/2017 03/20/2017 09/12/2016 05/30/2016 08/12/2015  Falls in the past year? No No No No No   Is the patient's home free of loose throw rugs in walkways, pet beds, electrical cords, etc?   yes      Grab bars in the bathroom? yes      Handrails on the stairs?   yes      Adequate lighting?   yes  Timed Get Up and Go Performed: complete din 8 seconds with no use of assistive devices. Steady gait. No intervention needed at this time.   Depression Screen PHQ 2/9 Scores 03/27/2017 03/20/2017 09/12/2016 08/12/2015  PHQ - 2 Score 0 0 0 0    Cognitive Function  unable to perform due to language barrier      Immunization History  Administered Date(s) Administered  . Influenza, High Dose Seasonal PF 11/16/2015, 12/21/2016  . Influenza,inj,Quad PF,6+ Mos 12/11/2014  . Influenza-Unspecified 12/05/2013  . Pneumococcal Conjugate-13 08/12/2015  . Pneumococcal-Unspecified 08/02/2012  . Tdap 11/16/2015    Qualifies for Shingles Vaccine? Discussed shingrix vaccine  Screening Tests Health Maintenance  Topic Date Due  . OPHTHALMOLOGY EXAM  12/25/2015  . FOOT EXAM  01/26/2016  . COLONOSCOPY  08/04/2017 (Originally 05/29/1998)  . PNA vac Low Risk Adult (2 of 2 - PPSV23) 08/02/2017  . HEMOGLOBIN A1C  09/17/2017  . TETANUS/TDAP  11/15/2025  . INFLUENZA VACCINE  Completed  . Hepatitis C Screening  Completed   Cancer Screenings: Lung: Low Dose CT Chest recommended if Age 24-80 years, 30 pack-year currently smoking OR have quit w/in 15years. Patient does not qualify. Colorectal: cologuard done 06/27/2016  Additional Screenings:  Hepatitis B/HIV/Syphillis: not  indicated Hepatitis C Screening: completed 08/12/2015    Plan:    I have personally reviewed and addressed the Medicare Annual Wellness questionnaire and have noted the following in the patient's chart:  A. Medical and social history B. Use of alcohol, tobacco or illicit drugs  C. Current medications and supplements D. Functional ability and status E.  Nutritional status F.  Physical activity G. Advance directives H. List of other physicians I.  Hospitalizations, surgeries, and ER visits in previous 12 months J.  Vitals K. Screenings such as hearing and vision if needed, cognitive and depression L. Referrals and appointments   In addition, I have reviewed and discussed with patient certain preventive protocols, quality metrics, and best practice recommendations. A written personalized care plan for preventive services as well as general preventive health recommendations were provided to patient.   Signed,  Makinzie Considine  Loleta ChanceHill, LPN Nurse Health Advisor   Nurse Notes: none

## 2017-06-26 ENCOUNTER — Ambulatory Visit: Payer: Medicare HMO | Admitting: Family Medicine

## 2017-07-12 ENCOUNTER — Encounter: Payer: Self-pay | Admitting: Family Medicine

## 2017-07-12 ENCOUNTER — Ambulatory Visit: Payer: Medicare HMO | Admitting: Family Medicine

## 2017-07-12 VITALS — BP 130/70 | HR 78 | Ht 63.0 in | Wt 153.0 lb

## 2017-07-12 DIAGNOSIS — E119 Type 2 diabetes mellitus without complications: Secondary | ICD-10-CM

## 2017-07-12 DIAGNOSIS — E785 Hyperlipidemia, unspecified: Secondary | ICD-10-CM | POA: Diagnosis not present

## 2017-07-12 DIAGNOSIS — I10 Essential (primary) hypertension: Secondary | ICD-10-CM | POA: Diagnosis not present

## 2017-07-12 LAB — BAYER DCA HB A1C WAIVED: HB A1C (BAYER DCA - WAIVED): 6.9 % (ref <7.0)

## 2017-07-12 NOTE — Assessment & Plan Note (Signed)
The current medical regimen is effective;  continue present plan and medications.  

## 2017-07-12 NOTE — Progress Notes (Signed)
BP 130/70 (BP Location: Left Arm)   Pulse 78   Ht  (1.6 m)   Wt 153 lb (69.4 kg)   SpO2 99%   BMI 27.10 kg/m    Subjective:    Patient ID: Edward Yang, male    DOB: 04/30/1950, 67 y.o.   MRN: [id number]  HPI: [name] is a 67 y.o. male  Chief Complaint  Patient presents with  . Follow-up  . Diabetes  Patient follow-up also assisted with interpreter doing well with diabetes no low blood sugar spells taking medications faithfully. Same with cholesterol and blood pressure doing well no complaints or medications taken faithfully with good control.  Relevant past medical, surgical, family and social history reviewed and updated as indicated. Interim medical history since our last visit reviewed. Allergies and medications reviewed and updated.  Review of Systems  Constitutional: Negative.   Respiratory: Negative.   Cardiovascular: Negative.     Per HPI unless specifically indicated above     Objective:    BP 130/70 (BP Location: Left Arm)   Pulse 78   Ht  (1.6 m)   Wt 153 lb (69.4 kg)   SpO2 99%   BMI 27.10 kg/m   Wt Readings from Last 3 Encounters:  07/12/17 153 lb (69.4 kg)  03/27/17 156 lb 4.8 oz (70.9 kg)  03/20/17 154 lb (69.9 kg)    Physical Exam  Constitutional: He is oriented to person, place, and time. He appears well-developed and well-nourished.  HENT:  Head: Normocephalic and atraumatic.  Eyes: Conjunctivae and EOM are normal.  Neck: Normal range of motion.  Cardiovascular: Normal rate, regular rhythm and normal heart sounds.  Pulmonary/Chest: Effort normal and breath sounds normal.  Musculoskeletal: Normal range of motion.  Neurological: He is alert and oriented to person, place, and time.  Skin: No erythema.  Psychiatric: He has a normal mood and affect. His behavior is normal. Judgment and thought content normal.    Results for orders placed or performed in visit on 03/20/17  Bayer DCA Hb A1c Waived  Result Value Ref Range   Bayer DCA Hb A1c Waived 7.0 (H) <7.0 %  Basic metabolic panel  Result Value Ref Range   Glucose 127 (H) 65 - 99 mg/dL   BUN 12 8 - 27 mg/dL   Creatinine, Ser 4.09 0.76 - 1.27 mg/dL   GFR calc non Af Amer 76 >59 mL/min/1.73   GFR calc Af Amer 88 >59 mL/min/1.73   BUN/Creatinine Ratio 12 10 - 24   Sodium 140 134 - 144 mmol/L   Potassium 3.9 3.5 - 5.2 mmol/L   Chloride 102 96 - 106 mmol/L   CO2 25 20 - 29 mmol/L   Calcium 9.4 8.6 - 10.2 mg/dL  LP+ALT+AST Piccolo, Waived  Result Value Ref Range   ALT (SGPT) Piccolo, Waived 40 10 - 47 U/L   AST (SGOT) Piccolo, Waived 26 11 - 38 U/L   Cholesterol Piccolo, Waived 175 <200 mg/dL   HDL Chol Piccolo, Waived 64 >59 mg/dL   Triglycerides Piccolo,Waived 180 (H) <150 mg/dL   Chol/HDL Ratio Piccolo,Waive 2.8 mg/dL   LDL Chol Calc Piccolo Waived 76 <100 mg/dL   VLDL Chol Calc Piccolo,Waive 36 (H) <30 mg/dL      Assessment & Plan:   Problem List Items Addressed This Visit      Cardiovascular and Mediastinum   Hypertension - Primary    The current medical regimen is effective;  continue present plan and  medications.       Relevant Orders   Bayer DCA Hb A1c Waived     Endocrine   Diabetes mellitus without complication (HCC)    The current medical regimen is effective;  continue present plan and medications.       Relevant Orders   Bayer DCA Hb A1c Waived     Other   Hyperlipidemia    The current medical regimen is effective;  continue present plan and medications.           Follow up plan: Return in about 3 months (around 10/12/2017) for Physical Exam, Hemoglobin A1c.

## 2017-07-17 IMAGING — CR DG CHEST 2V
2 series · 2 of 2 positions shown · non-contrast
Comparison: No priors.

CLINICAL DATA: 65-year-old male with productive cough for the past
3 weeks.

EXAM:
CHEST  2 VIEW

[chest lat]
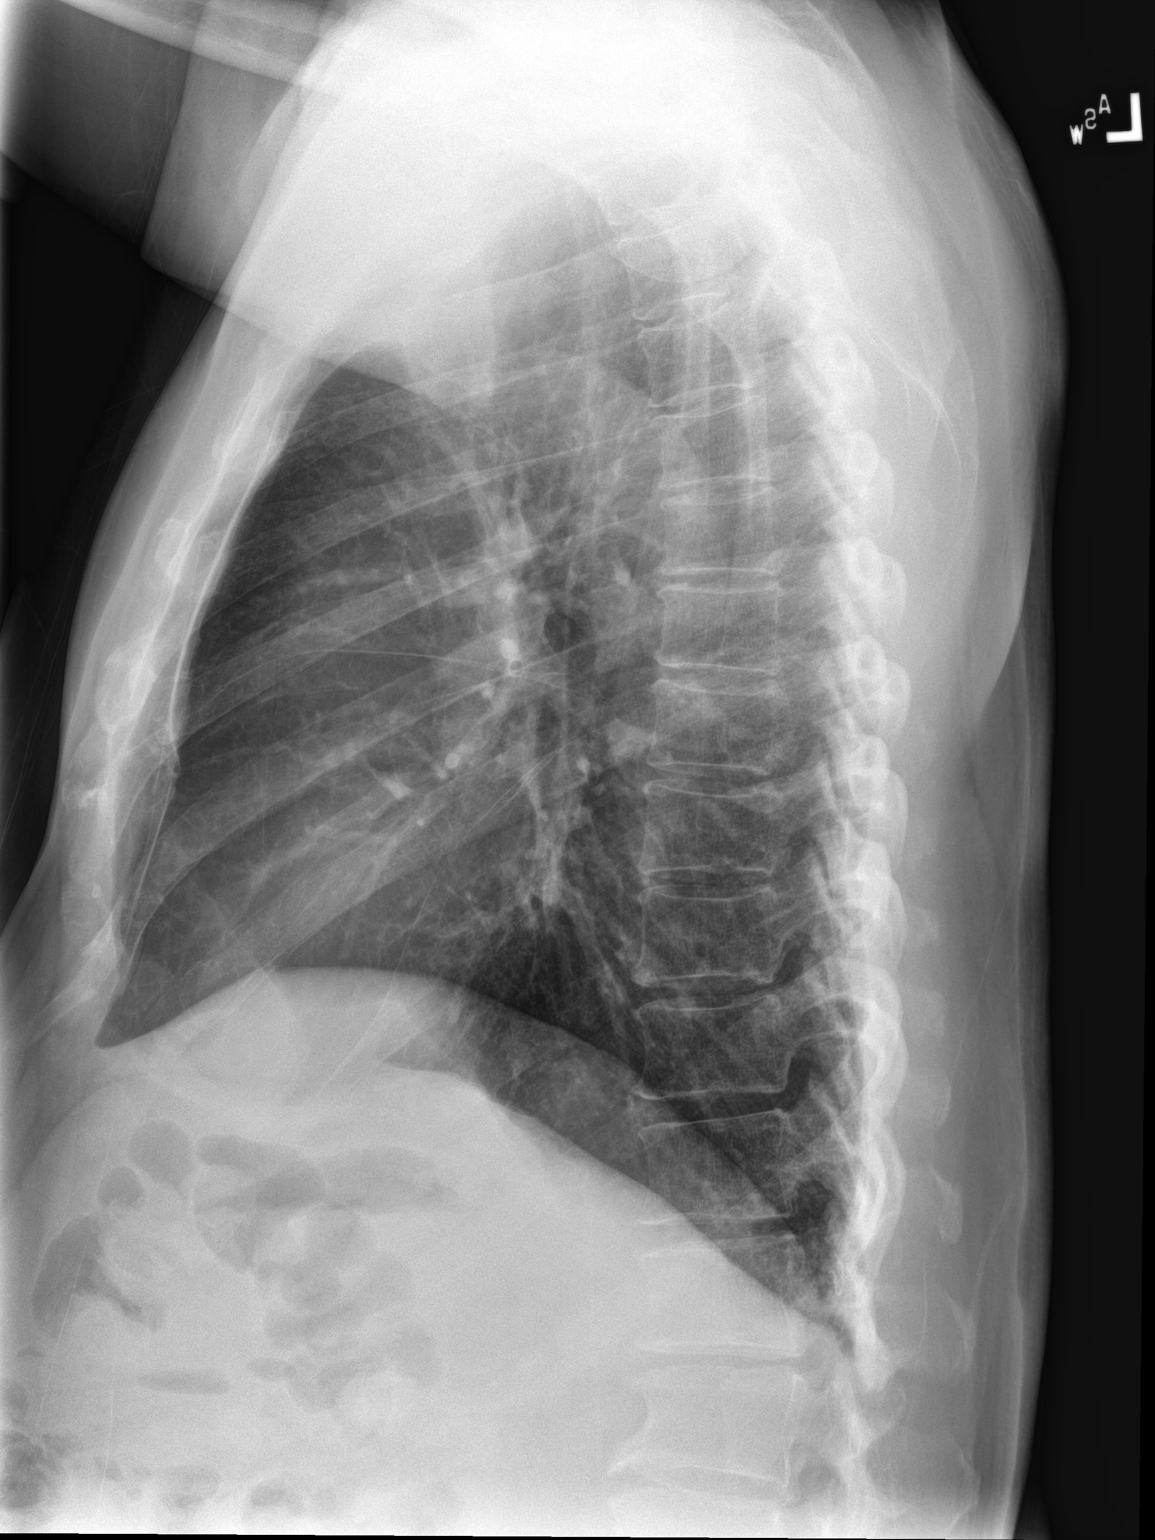

[chest pa]
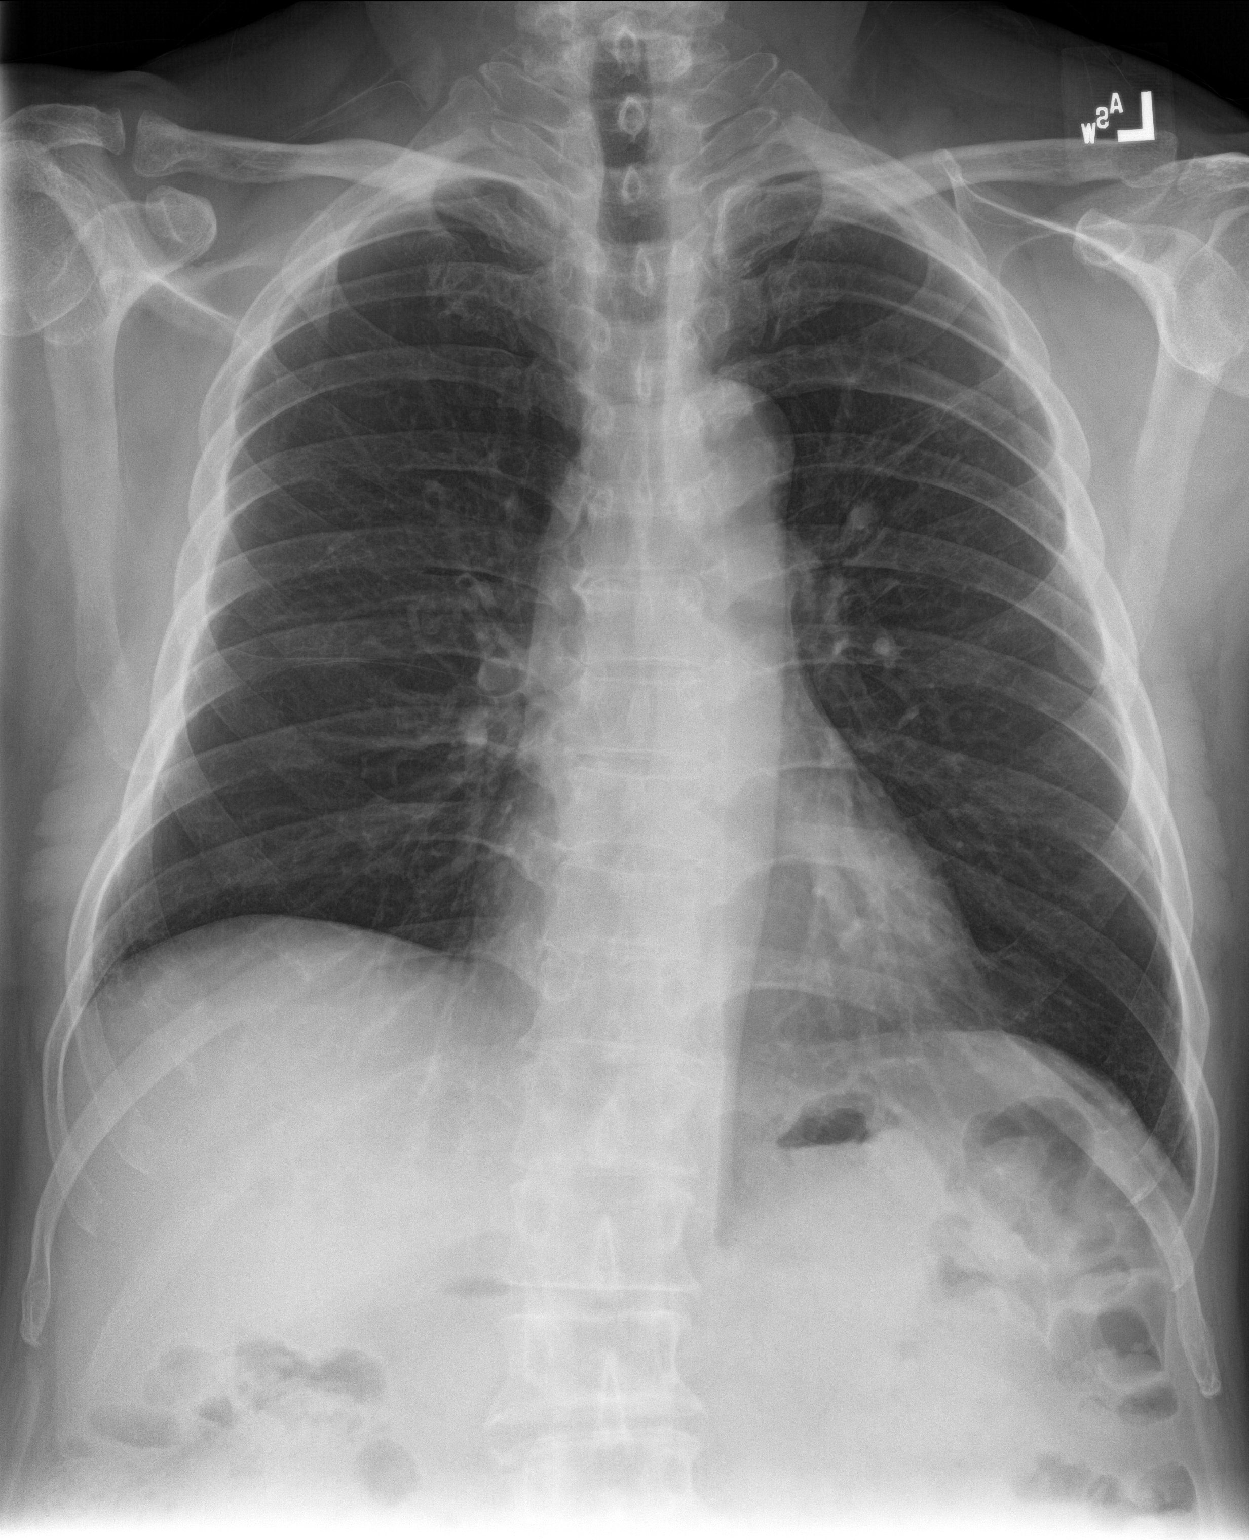

[2 of 2 positions shown; findings below may reference images not displayed]

FINDINGS: Lung volumes are normal. No consolidative airspace disease. No
pleural effusions. No pneumothorax. No pulmonary nodule or mass
noted. Pulmonary vasculature and the cardiomediastinal silhouette
are within normal limits.
IMPRESSION: No radiographic evidence of acute cardiopulmonary disease.

## 2017-09-25 ENCOUNTER — Ambulatory Visit (INDEPENDENT_AMBULATORY_CARE_PROVIDER_SITE_OTHER): Payer: Medicare HMO | Admitting: Family Medicine

## 2017-09-25 ENCOUNTER — Encounter: Payer: Self-pay | Admitting: Family Medicine

## 2017-09-25 VITALS — BP 135/73 | HR 87 | Ht 64.0 in | Wt 156.9 lb

## 2017-09-25 DIAGNOSIS — Z1329 Encounter for screening for other suspected endocrine disorder: Secondary | ICD-10-CM

## 2017-09-25 DIAGNOSIS — E119 Type 2 diabetes mellitus without complications: Secondary | ICD-10-CM

## 2017-09-25 DIAGNOSIS — E785 Hyperlipidemia, unspecified: Secondary | ICD-10-CM

## 2017-09-25 DIAGNOSIS — Z23 Encounter for immunization: Secondary | ICD-10-CM

## 2017-09-25 DIAGNOSIS — I10 Essential (primary) hypertension: Secondary | ICD-10-CM

## 2017-09-25 DIAGNOSIS — Z125 Encounter for screening for malignant neoplasm of prostate: Secondary | ICD-10-CM

## 2017-09-25 DIAGNOSIS — Z7189 Other specified counseling: Secondary | ICD-10-CM

## 2017-09-25 DIAGNOSIS — N400 Enlarged prostate without lower urinary tract symptoms: Secondary | ICD-10-CM | POA: Diagnosis not present

## 2017-09-25 DIAGNOSIS — E7800 Pure hypercholesterolemia, unspecified: Secondary | ICD-10-CM | POA: Diagnosis not present

## 2017-09-25 LAB — URINALYSIS, ROUTINE W REFLEX MICROSCOPIC
Bilirubin, UA: NEGATIVE
GLUCOSE, UA: NEGATIVE
KETONES UA: NEGATIVE
Leukocytes, UA: NEGATIVE
Nitrite, UA: NEGATIVE
Protein, UA: NEGATIVE
RBC, UA: NEGATIVE
SPEC GRAV UA: 1.015 (ref 1.005–1.030)
Urobilinogen, Ur: 1 mg/dL (ref 0.2–1.0)
pH, UA: 6 (ref 5.0–7.5)

## 2017-09-25 LAB — BAYER DCA HB A1C WAIVED: HB A1C: 7.5 % — AB (ref <7.0)

## 2017-09-25 MED ORDER — GLIPIZIDE 5 MG PO TABS: 5.0000 mg | ORAL_TABLET | Freq: Every day | ORAL | 4 refills | Status: DC

## 2017-09-25 MED ORDER — LISINOPRIL 10 MG PO TABS: 10.0000 mg | ORAL_TABLET | Freq: Every day | ORAL | 4 refills | Status: DC

## 2017-09-25 MED ORDER — METFORMIN HCL 500 MG PO TABS: 500.0000 mg | ORAL_TABLET | Freq: Two times a day (BID) | ORAL | 4 refills | Status: DC

## 2017-09-25 MED ORDER — PRAVASTATIN SODIUM 40 MG PO TABS: 40.0000 mg | ORAL_TABLET | Freq: Every day | ORAL | 4 refills | Status: DC

## 2017-09-25 NOTE — Assessment & Plan Note (Signed)
stable °

## 2017-09-25 NOTE — Assessment & Plan Note (Signed)
The current medical regimen is effective;  continue present plan and medications.  

## 2017-09-25 NOTE — Assessment & Plan Note (Signed)
Discussed poor control with hemoglobin A1c of 7.5 patient will do better with diet exercise nutrition weight loss and recheck 3 months

## 2017-09-25 NOTE — Assessment & Plan Note (Signed)
A voluntary discussion about advanced care planning including explanation and discussion of advanced directives was extentively discussed with the patient.  Explained about the healthcare proxy and living will was reviewed and packet with forms with expiration of how to fill them out was given.  Time spent: Encounter 16+ min individuals present: Patient 

## 2017-09-25 NOTE — Progress Notes (Signed)
BP 135/73   Pulse 87   Ht 5\' 4"  (1.626 m)   Wt 156 lb 14.4 oz (71.2 kg)   SpO2 98%   BMI 26.93 kg/m    Subjective:    Patient ID: Edward Yang, male    DOB: November 16, 1950, 67 y.o.   MRN: [id number]  HPI: Edward Yang is a 67 y.o. male  Chief Complaint  Patient presents with  . Annual Exam  . Hypertension  Patient accompanied by interpreter otherwise doing well blood pressure good control with no complaints from medications. Same for withdrawal with pravastatin. Also taking metformin and glipizide with no issues or low blood sugar spells.  Relevant past medical, surgical, family and social history reviewed and updated as indicated. Interim medical history since our last visit reviewed. Allergies and medications reviewed and updated.  Review of Systems  Constitutional: Negative.   HENT: Negative.   Eyes: Negative.   Respiratory: Negative.   Cardiovascular: Negative.   Gastrointestinal: Negative.   Endocrine: Negative.   Genitourinary: Negative.   Musculoskeletal: Negative.   Skin: Negative.   Allergic/Immunologic: Negative.   Neurological: Negative.   Hematological: Negative.   Psychiatric/Behavioral: Negative.     Per HPI unless specifically indicated above     Objective:    BP 135/73   Pulse 87   Ht 5\' 4"  (1.626 m)   Wt 156 lb 14.4 oz (71.2 kg)   SpO2 98%   BMI 26.93 kg/m   Wt Readings from Last 3 Encounters:  09/25/17 156 lb 14.4 oz (71.2 kg)  07/12/17 153 lb (69.4 kg)  03/27/17 156 lb 4.8 oz (70.9 kg)    Physical Exam  Constitutional: He is oriented to person, place, and time. He appears well-developed and well-nourished.  HENT:  Head: Normocephalic and atraumatic.  Right Ear: External ear normal.  Left Ear: External ear normal.  Eyes: Pupils are equal, round, and reactive to light. Conjunctivae and EOM are normal.  Neck: Normal range of motion. Neck supple.  Cardiovascular: Normal rate, regular rhythm, normal heart sounds and intact distal pulses.    Pulmonary/Chest: Effort normal and breath sounds normal.  Abdominal: Soft. Bowel sounds are normal. There is no splenomegaly or hepatomegaly.  Genitourinary: Rectum normal, prostate normal and penis normal.  Musculoskeletal: Normal range of motion.  Neurological: He is alert and oriented to person, place, and time. He has normal reflexes.  Skin: No rash noted. No erythema.  Psychiatric: He has a normal mood and affect. His behavior is normal. Judgment and thought content normal.    Results for orders placed or performed in visit on 07/12/17  Bayer DCA Hb A1c Waived  Result Value Ref Range   HB A1C (BAYER DCA - WAIVED) 6.9 <7.0 %      Assessment & Plan:   Problem List Items Addressed This Visit      Cardiovascular and Mediastinum   Hypertension - Primary    The current medical regimen is effective;  continue present plan and medications.       Relevant Medications   pravastatin (PRAVACHOL) 40 MG tablet   lisinopril (PRINIVIL,ZESTRIL) 10 MG tablet   Other Relevant Orders   Bayer DCA Hb A1c Waived   CBC with Differential/Platelet   Comprehensive metabolic panel   Lipid panel   Urinalysis, Routine w reflex microscopic     Endocrine   Diabetes mellitus without complication (HCC)    Discussed poor control with hemoglobin A1c of 7.5 patient will do better with diet exercise nutrition  weight loss and recheck 3 months      Relevant Medications   pravastatin (PRAVACHOL) 40 MG tablet   metFORMIN (GLUCOPHAGE) 500 MG tablet   lisinopril (PRINIVIL,ZESTRIL) 10 MG tablet   glipiZIDE (GLUCOTROL) 5 MG tablet   Other Relevant Orders   Bayer DCA Hb A1c Waived   CBC with Differential/Platelet   Comprehensive metabolic panel   Lipid panel   Urinalysis, Routine w reflex microscopic     Genitourinary   BPH (benign prostatic hyperplasia)    stable      Relevant Orders   PSA     Other   Hyperlipidemia    The current medical regimen is effective;  continue present plan and  medications.       Relevant Medications   pravastatin (PRAVACHOL) 40 MG tablet   lisinopril (PRINIVIL,ZESTRIL) 10 MG tablet   Other Relevant Orders   Bayer DCA Hb A1c Waived   CBC with Differential/Platelet   Comprehensive metabolic panel   Lipid panel   Urinalysis, Routine w reflex microscopic   Advanced care planning/counseling discussion    A voluntary discussion about advanced care planning including explanation and discussion of advanced directives was extentively discussed with the patient.  Explained about the healthcare proxy and living will was reviewed and packet with forms with expiration of how to fill them out was given.  Time spent: Encounter 16+ min individuals present: Patient       Other Visit Diagnoses    Prostate cancer screening       Relevant Orders   PSA   Thyroid disorder screen       Relevant Orders   TSH   Need for pneumococcal vaccination       Relevant Orders   Pneumococcal polysaccharide vaccine 23-valent greater than or equal to 69yo subcutaneous/IM (Completed)       Follow up plan: Return in about 3 months (around 12/26/2017) for Hemoglobin A1c.

## 2017-09-26 ENCOUNTER — Encounter: Payer: Self-pay | Admitting: Family Medicine

## 2017-09-26 LAB — COMPREHENSIVE METABOLIC PANEL WITH GFR
ALT: 42 IU/L (ref 0–44)
AST: 26 IU/L (ref 0–40)
Albumin/Globulin Ratio: 1.9 (ref 1.2–2.2)
Albumin: 4.6 g/dL (ref 3.6–4.8)
Alkaline Phosphatase: 101 IU/L (ref 39–117)
BUN/Creatinine Ratio: 13 (ref 10–24)
BUN: 11 mg/dL (ref 8–27)
Bilirubin Total: 0.5 mg/dL (ref 0.0–1.2)
CO2: 23 mmol/L (ref 20–29)
Calcium: 9.6 mg/dL (ref 8.6–10.2)
Chloride: 102 mmol/L (ref 96–106)
Creatinine, Ser: 0.86 mg/dL (ref 0.76–1.27)
GFR calc Af Amer: 102 mL/min/1.73
GFR calc non Af Amer: 88 mL/min/1.73
Globulin, Total: 2.4 g/dL (ref 1.5–4.5)
Glucose: 75 mg/dL (ref 65–99)
Potassium: 4 mmol/L (ref 3.5–5.2)
Sodium: 141 mmol/L (ref 134–144)
Total Protein: 7 g/dL (ref 6.0–8.5)

## 2017-09-26 LAB — LIPID PANEL
CHOL/HDL RATIO: 2.7 ratio (ref 0.0–5.0)
Cholesterol, Total: 164 mg/dL (ref 100–199)
HDL: 61 mg/dL (ref >39)
LDL Calculated: 62 mg/dL (ref 0–99)
TRIGLYCERIDES: 203 mg/dL — AB (ref 0–149)
VLDL Cholesterol Cal: 41 mg/dL — ABNORMAL HIGH (ref 5–40)

## 2017-09-26 LAB — CBC WITH DIFFERENTIAL/PLATELET
Basophils Absolute: 0 10*3/uL (ref 0.0–0.2)
Basos: 1 %
EOS (ABSOLUTE): 0.2 10*3/uL (ref 0.0–0.4)
Eos: 3 %
Hematocrit: 45.5 % (ref 37.5–51.0)
Hemoglobin: 14.8 g/dL (ref 13.0–17.7)
Immature Grans (Abs): 0 10*3/uL (ref 0.0–0.1)
Immature Granulocytes: 0 %
LYMPHS: 34 %
Lymphocytes Absolute: 2.8 10*3/uL (ref 0.7–3.1)
MCH: 30.9 pg (ref 26.6–33.0)
MCHC: 32.5 g/dL (ref 31.5–35.7)
MCV: 95 fL (ref 79–97)
MONOS ABS: 0.4 10*3/uL (ref 0.1–0.9)
Monocytes: 5 %
NEUTROS PCT: 57 %
Neutrophils Absolute: 4.8 10*3/uL (ref 1.4–7.0)
PLATELETS: 161 10*3/uL (ref 150–450)
RBC: 4.79 x10E6/uL (ref 4.14–5.80)
RDW: 13 % (ref 12.3–15.4)
WBC: 8.3 10*3/uL (ref 3.4–10.8)

## 2017-09-26 LAB — TSH: TSH: 2.09 u[IU]/mL (ref 0.450–4.500)

## 2017-09-26 LAB — PSA: Prostate Specific Ag, Serum: 0.6 ng/mL (ref 0.0–4.0)

## 2018-01-01 ENCOUNTER — Ambulatory Visit (INDEPENDENT_AMBULATORY_CARE_PROVIDER_SITE_OTHER): Payer: Medicare HMO | Admitting: Family Medicine

## 2018-01-01 ENCOUNTER — Encounter: Payer: Self-pay | Admitting: Family Medicine

## 2018-01-01 VITALS — BP 127/72 | HR 76 | Wt 151.0 lb

## 2018-01-01 DIAGNOSIS — I10 Essential (primary) hypertension: Secondary | ICD-10-CM

## 2018-01-01 DIAGNOSIS — E785 Hyperlipidemia, unspecified: Secondary | ICD-10-CM | POA: Diagnosis not present

## 2018-01-01 DIAGNOSIS — E119 Type 2 diabetes mellitus without complications: Secondary | ICD-10-CM

## 2018-01-01 LAB — BAYER DCA HB A1C WAIVED: HB A1C (BAYER DCA - WAIVED): 6.7 % (ref <7.0)

## 2018-01-01 MED ORDER — SILDENAFIL CITRATE 20 MG PO TABS: 20.0000 mg | ORAL_TABLET | ORAL | 12 refills | Status: DC | PRN

## 2018-01-01 NOTE — Assessment & Plan Note (Signed)
The current medical regimen is effective;  continue present plan and medications.  

## 2018-01-01 NOTE — Progress Notes (Signed)
BP 127/72   Pulse 76   Wt 151 lb (68.5 kg)   SpO2 98%   BMI 25.92 kg/m    Subjective:    Patient ID: Edward Yang, male    DOB: Jul 21, 1950, 67 y.o.   MRN: [id number]  HPI: Edward Yang is a 67 y.o. male  Chief Complaint  Patient presents with  . Follow-up    Jee # [id number]  interpreter # [phone] Interpreter use was from remote which worked well. Patient all in all doing well with diabetes no complaints no low blood sugar spells. Other medications doing well without any complaints or concerns. Blood pressure doing well with good control. Patient's been working hard for good diet control been getting approximately 10,000 steps a day  Relevant past medical, surgical, family and social history reviewed and updated as indicated. Interim medical history since our last visit reviewed. Allergies and medications reviewed and updated.  Review of Systems  Constitutional: Negative.   Respiratory: Negative.   Cardiovascular: Negative.     Per HPI unless specifically indicated above     Objective:    BP 127/72   Pulse 76   Wt 151 lb (68.5 kg)   SpO2 98%   BMI 25.92 kg/m   Wt Readings from Last 3 Encounters:  01/01/18 151 lb (68.5 kg)  09/25/17 156 lb 14.4 oz (71.2 kg)  07/12/17 153 lb (69.4 kg)    Physical Exam  Constitutional: He is oriented to person, place, and time. He appears well-developed and well-nourished.  HENT:  Head: Normocephalic and atraumatic.  Eyes: Conjunctivae and EOM are normal.  Neck: Normal range of motion.  Cardiovascular: Normal rate, regular rhythm and normal heart sounds.  Pulmonary/Chest: Effort normal and breath sounds normal.  Musculoskeletal: Normal range of motion.  Neurological: He is alert and oriented to person, place, and time.  Skin: No erythema.  Psychiatric: He has a normal mood and affect. His behavior is normal. Judgment and thought content normal.    Results for orders placed or performed in visit on 09/25/17  Bayer DCA Hb A1c  Waived  Result Value Ref Range   HB A1C (BAYER DCA - WAIVED) 7.5 (H) <7.0 %  CBC with Differential/Platelet  Result Value Ref Range   WBC 8.3 3.4 - 10.8 x10E3/uL   RBC 4.79 4.14 - 5.80 x10E6/uL   Hemoglobin 14.8 13.0 - 17.7 g/dL   Hematocrit 14.7 82.9 - 51.0 %   MCV 95 79 - 97 fL   MCH 30.9 26.6 - 33.0 pg   MCHC 32.5 31.5 - 35.7 g/dL   RDW 56.2 13.0 - 86.5 %   Platelets 161 150 - 450 x10E3/uL   Neutrophils 57 Not Estab. %   Lymphs 34 Not Estab. %   Monocytes 5 Not Estab. %   Eos 3 Not Estab. %   Basos 1 Not Estab. %   Neutrophils Absolute 4.8 1.4 - 7.0 x10E3/uL   Lymphocytes Absolute 2.8 0.7 - 3.1 x10E3/uL   Monocytes Absolute 0.4 0.1 - 0.9 x10E3/uL   EOS (ABSOLUTE) 0.2 0.0 - 0.4 x10E3/uL   Basophils Absolute 0.0 0.0 - 0.2 x10E3/uL   Immature Granulocytes 0 Not Estab. %   Immature Grans (Abs) 0.0 0.0 - 0.1 x10E3/uL  Comprehensive metabolic panel  Result Value Ref Range   Glucose 75 65 - 99 mg/dL   BUN 11 8 - 27 mg/dL   Creatinine, Ser 7.84 0.76 - 1.27 mg/dL   GFR calc non Af Amer 88 >59 mL/min/1.73  GFR calc Af Amer 102 >59 mL/min/1.73   BUN/Creatinine Ratio 13 10 - 24   Sodium 141 134 - 144 mmol/L   Potassium 4.0 3.5 - 5.2 mmol/L   Chloride 102 96 - 106 mmol/L   CO2 23 20 - 29 mmol/L   Calcium 9.6 8.6 - 10.2 mg/dL   Total Protein 7.0 6.0 - 8.5 g/dL   Albumin 4.6 3.6 - 4.8 g/dL   Globulin, Total 2.4 1.5 - 4.5 g/dL   Albumin/Globulin Ratio 1.9 1.2 - 2.2   Bilirubin Total 0.5 0.0 - 1.2 mg/dL   Alkaline Phosphatase 101 39 - 117 IU/L   AST 26 0 - 40 IU/L   ALT 42 0 - 44 IU/L  Lipid panel  Result Value Ref Range   Cholesterol, Total 164 100 - 199 mg/dL   Triglycerides 409 (H) 0 - 149 mg/dL   HDL 61 >81 mg/dL   VLDL Cholesterol Cal 41 (H) 5 - 40 mg/dL   LDL Calculated 62 0 - 99 mg/dL   Chol/HDL Ratio 2.7 0.0 - 5.0 ratio  PSA  Result Value Ref Range   Prostate Specific Ag, Serum 0.6 0.0 - 4.0 ng/mL  TSH  Result Value Ref Range   TSH 2.090 0.450 - 4.500 uIU/mL    Urinalysis, Routine w reflex microscopic  Result Value Ref Range   Specific Gravity, UA 1.015 1.005 - 1.030   pH, UA 6.0 5.0 - 7.5   Color, UA Yellow Yellow   Appearance Ur Clear Clear   Leukocytes, UA Negative Negative   Protein, UA Negative Negative/Trace   Glucose, UA Negative Negative   Ketones, UA Negative Negative   RBC, UA Negative Negative   Bilirubin, UA Negative Negative   Urobilinogen, Ur 1.0 0.2 - 1.0 mg/dL   Nitrite, UA Negative Negative      Assessment & Plan:   Problem List Items Addressed This Visit      Cardiovascular and Mediastinum   Hypertension - Primary    The current medical regimen is effective;  continue present plan and medications.       Relevant Medications   sildenafil (REVATIO) 20 MG tablet   Other Relevant Orders   Bayer DCA Hb A1c Waived     Endocrine   Diabetes mellitus without complication (HCC)    The current medical regimen is effective;  continue present plan and medications.       Relevant Orders   Bayer DCA Hb A1c Waived     Other   Hyperlipidemia    The current medical regimen is effective;  continue present plan and medications.       Relevant Medications   sildenafil (REVATIO) 20 MG tablet   Other Relevant Orders   Bayer DCA Hb A1c Waived       Follow up plan: Return in about 3 months (around 04/03/2018) for BMP,  Lipids, ALT, AST, Hemoglobin A1c.

## 2018-01-01 NOTE — Progress Notes (Signed)
Patient states he prefers to have translator in office vs the video call.

## 2018-04-02 ENCOUNTER — Ambulatory Visit: Payer: Self-pay

## 2018-04-04 ENCOUNTER — Ambulatory Visit (INDEPENDENT_AMBULATORY_CARE_PROVIDER_SITE_OTHER): Payer: Medicare HMO | Admitting: Family Medicine

## 2018-04-04 ENCOUNTER — Encounter: Payer: Self-pay | Admitting: Family Medicine

## 2018-04-04 VITALS — BP 121/66 | HR 80 | Temp 98.2°F | Ht 64.0 in | Wt 154.7 lb

## 2018-04-04 DIAGNOSIS — E1169 Type 2 diabetes mellitus with other specified complication: Secondary | ICD-10-CM

## 2018-04-04 DIAGNOSIS — E1159 Type 2 diabetes mellitus with other circulatory complications: Secondary | ICD-10-CM

## 2018-04-04 DIAGNOSIS — E785 Hyperlipidemia, unspecified: Secondary | ICD-10-CM

## 2018-04-04 DIAGNOSIS — I10 Essential (primary) hypertension: Secondary | ICD-10-CM

## 2018-04-04 DIAGNOSIS — E119 Type 2 diabetes mellitus without complications: Secondary | ICD-10-CM | POA: Diagnosis not present

## 2018-04-04 DIAGNOSIS — E669 Obesity, unspecified: Secondary | ICD-10-CM

## 2018-04-04 LAB — LP+ALT+AST PICCOLO, WAIVED
ALT (SGPT) PICCOLO, WAIVED: 48 U/L — AB (ref 10–47)
AST (SGOT) Piccolo, Waived: 36 U/L (ref 11–38)
CHOLESTEROL PICCOLO, WAIVED: 156 mg/dL (ref <200)
Chol/HDL Ratio Piccolo,Waive: 2.3 mg/dL
HDL CHOL PICCOLO, WAIVED: 67 mg/dL (ref >59)
LDL Chol Calc Piccolo Waived: 53 mg/dL (ref <100)
Triglycerides Piccolo,Waived: 182 mg/dL — ABNORMAL HIGH (ref <150)
VLDL CHOL CALC PICCOLO,WAIVE: 36 mg/dL — AB (ref <30)

## 2018-04-04 LAB — BAYER DCA HB A1C WAIVED: HB A1C (BAYER DCA - WAIVED): 7.2 % — ABNORMAL HIGH (ref <7.0)

## 2018-04-04 NOTE — Assessment & Plan Note (Signed)
The current medical regimen is effective;  continue present plan and medications.  

## 2018-04-04 NOTE — Progress Notes (Signed)
BP 121/66 (BP Location: Left Arm, Patient Position: Sitting, Cuff Size: Normal)   Pulse 80   Temp 98.2 F (36.8 C) (Oral)   Ht 5\' 4"  (1.626 m)   Wt 154 lb 11.2 oz (70.2 kg)   SpO2 95%   BMI 26.55 kg/m    Subjective:    Patient ID: Edward Yang, male    DOB: 01/01/1951, 68 y.o.   MRN: [id number]  HPI: Edward Phoenixsaac Y Berent is a 68 y.o. male  Chief Complaint  Patient presents with  . Diabetes  . Hyperlipidemia  . Hypertension  Patient recheck doing well no complaints taking glipizide and metformin for diabetes with no low blood sugar spells or issues. Blood pressure doing well with lisinopril 10 mg. Same for cholesterol doing well with no side effects with pravastatin 40 mg.  Relevant past medical, surgical, family and social history reviewed and updated as indicated. Interim medical history since our last visit reviewed. Allergies and medications reviewed and updated.  Review of Systems  Constitutional: Negative.   Respiratory: Negative.   Cardiovascular: Negative.     Per HPI unless specifically indicated above     Objective:    BP 121/66 (BP Location: Left Arm, Patient Position: Sitting, Cuff Size: Normal)   Pulse 80   Temp 98.2 F (36.8 C) (Oral)   Ht 5\' 4"  (1.626 m)   Wt 154 lb 11.2 oz (70.2 kg)   SpO2 95%   BMI 26.55 kg/m   Wt Readings from Last 3 Encounters:  04/04/18 154 lb 11.2 oz (70.2 kg)  01/01/18 151 lb (68.5 kg)  09/25/17 156 lb 14.4 oz (71.2 kg)    Physical Exam Constitutional:      Appearance: He is well-developed.  HENT:     Head: Normocephalic and atraumatic.  Eyes:     Conjunctiva/sclera: Conjunctivae normal.  Neck:     Musculoskeletal: Normal range of motion.  Cardiovascular:     Rate and Rhythm: Normal rate and regular rhythm.     Heart sounds: Normal heart sounds.  Pulmonary:     Effort: Pulmonary effort is normal.     Breath sounds: Normal breath sounds.  Musculoskeletal: Normal range of motion.  Skin:    Findings: No erythema.    Neurological:     Mental Status: He is alert and oriented to person, place, and time.  Psychiatric:        Behavior: Behavior normal.        Thought Content: Thought content normal.        Judgment: Judgment normal.     Results for orders placed or performed in visit on 01/01/18  Bayer DCA Hb A1c Waived  Result Value Ref Range   HB A1C (BAYER DCA - WAIVED) 6.7 <7.0 %      Assessment & Plan:   Problem List Items Addressed This Visit      Cardiovascular and Mediastinum   Obesity, diabetes, and hypertension syndrome (HCC)    The current medical regimen is effective;  continue present plan and medications.       Hypertension - Primary    The current medical regimen is effective;  continue present plan and medications.       Relevant Orders   Basic metabolic panel     Other   Hyperlipidemia    The current medical regimen is effective;  continue present plan and medications.       Relevant Orders   [address]LP[facility] [place], [address]Wa[address]  Follow up plan: Return in about 6 months (around 10/03/2018) for Physical Exam, Hemoglobin A1c.

## 2018-04-05 ENCOUNTER — Encounter: Payer: Self-pay | Admitting: Family Medicine

## 2018-04-05 LAB — BASIC METABOLIC PANEL
BUN / CREAT RATIO: 12 (ref 10–24)
BUN: 12 mg/dL (ref 8–27)
CO2: 21 mmol/L (ref 20–29)
Calcium: 9.1 mg/dL (ref 8.6–10.2)
Chloride: 103 mmol/L (ref 96–106)
Creatinine, Ser: 1.03 mg/dL (ref 0.76–1.27)
GFR calc non Af Amer: 74 mL/min/{1.73_m2} (ref >59)
GFR, EST AFRICAN AMERICAN: 85 mL/min/{1.73_m2} (ref >59)
GLUCOSE: 112 mg/dL — AB (ref 65–99)
POTASSIUM: 4.2 mmol/L (ref 3.5–5.2)
Sodium: 140 mmol/L (ref 134–144)

## 2018-04-11 ENCOUNTER — Ambulatory Visit (INDEPENDENT_AMBULATORY_CARE_PROVIDER_SITE_OTHER): Payer: Medicare HMO

## 2018-04-11 VITALS — BP 122/66 | HR 64 | Temp 97.6°F | Resp 16 | Ht 64.0 in | Wt 155.7 lb

## 2018-04-11 DIAGNOSIS — Z0000 Encounter for general adult medical examination without abnormal findings: Secondary | ICD-10-CM

## 2018-04-11 NOTE — Progress Notes (Addendum)
Subjective:   Edward Yang is a 68 y.o. male who presents for Medicare Annual/Subsequent preventive examination.  Review of Systems:   Cardiac Risk Factors include: advanced age (>59men, >17 women);diabetes mellitus;hypertension;male gender;dyslipidemia     Objective:    Vitals: BP 122/66 (BP Location: Left Arm, Patient Position: Sitting, Cuff Size: Normal)   Pulse 64   Temp 97.6 F (36.4 C) (Temporal)   Resp 16   Ht 5\' 4"  (1.626 m)   Wt 155 lb 11.2 oz (70.6 kg)   BMI 26.73 kg/m   Body mass index is 26.73 kg/m.  Advanced Directives 04/11/2018  Does Patient Have a Medical Advance Directive? No  Would patient like information on creating a medical advance directive? Yes (MAU/Ambulatory/Procedural Areas - Information given)    Tobacco Social History   Tobacco Use  Smoking Status Former Smoker  . Last attempt to quit: 11/03/1994  . Years since quitting: 23.4  Smokeless Tobacco Never Used     Counseling given: Not Answered   Clinical Intake:  Pre-visit preparation completed: Yes  Pain : No/denies pain     Nutrition Risk Assessment:  Has the patient had any N/V/D within the last 2 months?  No  Does the patient have any non-healing wounds?  No  Has the patient had any unintentional weight loss or weight gain?  No   Diabetes:  Is the patient diabetic?  Yes  If diabetic, was a CBG obtained today?  No  Did the patient bring in their glucometer from home?  No  How often do you monitor your CBG's? doesnt check at home.   Financial Strains and Diabetes Management:  Are you having any financial strains with the device, your supplies or your medication? No .   Diabetic Exams:  Diabetic Eye Exam:  Overdue for diabetic eye exam. Pt has been advised about the importance in completing this exam. A referral has been placed today. Message sent to referral coordinator for scheduling purposes. Advised pt to expect a call from our office re: appt.  Diabetic Foot Exam:  Completed 09/25/2017.  Pt has been advised about the importance in completing this exam.   How often do you need to have someone help you when you read instructions, pamphlets, or other written materials from your doctor or pharmacy?: 1 - Never What is the last grade level you completed in school?: college   Interpreter Needed?: Yes Interpreter Name: Brantley Stage Patient Declined Interpreter : No Patient signed [place] waiver: Yes  Information entered by :: Shantinique Picazo,LPN   Past Medical History:  Diagnosis Date  . Diabetes mellitus without complication (HCC)   . Hyperlipidemia   . Hypertension    History reviewed. No pertinent surgical history. Family History  Problem Relation Age of Onset  . Healthy Mother   . Healthy Father    Social History   Socioeconomic History  . Marital status: Married    Spouse name: Not on file  . Number of children: Not on file  . Years of education: Not on file  . Highest education level: Some college, no degree  Occupational History  . Occupation: [employer]     Comment: full time   Social Needs  . Financial resource strain: Not hard at all  . Food insecurity:    Worry: Never true    Inability: Never true  . Transportation needs:    Medical: No    Non-medical: No  Tobacco Use  . Smoking status: Former Smoker  Last attempt to quit: 11/03/1994    Years since quitting: 23.4  . Smokeless tobacco: Never Used  Substance and Sexual Activity  . Alcohol use: No    Alcohol/week: 0.0 standard drinks  . Drug use: No  . Sexual activity: Not on file  Lifestyle  . Physical activity:    Days per week: 4 days    Minutes per session: 60 min  . Stress: Not at all  Relationships  . Social connections:    Talks on phone: More than three times a week    Gets together: Three times a week    Attends religious service: More than 4 times per year    Active member of club or organization: No    Attends meetings of clubs or  organizations: Never    Relationship status: Married  Other Topics Concern  . Not on file  Social History Narrative  . Not on file    Outpatient Encounter Medications as of 04/11/2018  Medication Sig  . glipiZIDE (GLUCOTROL) 5 MG tablet Take 1 tablet (5 mg total) by mouth daily before breakfast.  . lisinopril (PRINIVIL,ZESTRIL) 10 MG tablet Take 1 tablet (10 mg total) by mouth daily.  . metFORMIN (GLUCOPHAGE) 500 MG tablet Take 1 tablet (500 mg total) by mouth 2 (two) times daily with a meal.  . pravastatin (PRAVACHOL) 40 MG tablet Take 1 tablet (40 mg total) by mouth daily.  . sildenafil (REVATIO) 20 MG tablet Take 1-5 tablets (20-100 mg total) by mouth as needed.   No facility-administered encounter medications on file as of 04/11/2018.     Activities of Daily Living In your present state of health, do you have any difficulty performing the following activities: 09/25/2017  Hearing? N  Vision? N  Difficulty concentrating or making decisions? N  Walking or climbing stairs? N  Dressing or bathing? N  Doing errands, shopping? N  Some recent data might be hidden    Patient Care Team: Steele Sizerrissman, Mark A, MD as PCP - General (Family Medicine)   Assessment:   This is a routine wellness examination for Edward Yang.  Exercise Activities and Dietary recommendations Current Exercise Habits: Home exercise routine([employer] ), Type of exercise: walking;strength training/weights(swimming ), Time (Minutes): 60, Frequency (Times/Week): 5, Weekly Exercise (Minutes/Week): 300, Intensity: Mild, Exercise limited by: None identified  Goals    . DIET - INCREASE WATER INTAKE     Recommend drinking at least 6-8 glasses of water a day       Fall Risk Fall Risk  04/11/2018 01/01/2018 09/25/2017 07/12/2017 03/27/2017  Falls in the past year? 0 No No No No  Number falls in past yr: 0 - - - -   FALL RISK PREVENTION PERTAINING TO THE HOME:  Any stairs in or around the home WITH handrails? Yes  all  stairs have handrails  Home free of loose throw rugs in walkways, pet beds, electrical cords, etc? Yes  Adequate lighting in your home to reduce risk of falls? Yes   ASSISTIVE DEVICES UTILIZED TO PREVENT FALLS:  Life alert? No  Use of a cane, walker or w/c? No  Grab bars in the bathroom? yes Shower chair or bench in shower? No  Elevated toilet seat or a handicapped toilet? No   DME ORDERS:  DME order needed?  No   TIMED UP AND GO:  Was the test performed? No .       Depression Screen PHQ 2/9 Scores 04/11/2018 09/25/2017 07/12/2017 03/27/2017  PHQ -  2 Score 0 0 0 0    Cognitive Function        Immunization History  Administered Date(s) Administered  . Influenza, High Dose Seasonal PF 11/16/2015, 12/21/2016, 11/12/2017  . Influenza,inj,Quad PF,6+ Mos 12/11/2014  . Influenza-Unspecified 12/05/2013, 11/12/2017  . Pneumococcal Conjugate-13 08/12/2015  . Pneumococcal Polysaccharide-23 09/25/2017  . Pneumococcal-Unspecified 08/02/2012  . Tdap 11/16/2015  . Zoster Recombinat (Shingrix) 08/11/2017, 11/12/2017    Qualifies for Shingles Vaccine? Up to date  Tdap: up to date  Flu Vaccine: up to date .  Pneumococcal Vaccine: up to date   Screening Tests Health Maintenance  Topic Date Due  . OPHTHALMOLOGY EXAM  12/25/2015  . FOOT EXAM  09/26/2018  . HEMOGLOBIN A1C  10/03/2018  . Fecal DNA (Cologuard)  06/28/2019  . TETANUS/TDAP  11/15/2025  . INFLUENZA VACCINE  Completed  . Hepatitis C Screening  Completed  . PNA vac Low Risk Adult  Completed   Cancer Screenings:  Colorectal Screening: cologuard completed 06/2016, due 06/2019  Lung Cancer Screening: (Low Dose CT Chest recommended if Age 81-80 years, 30 pack-year currently smoking OR have quit w/in 15years.) does not qualify.  .  Additional Screening:  Hepatitis C Screening: completed 08/12/2015  Vision Screening: Recommended annual ophthalmology exams for early detection of glaucoma and other disorders of the  eye. Is the patient up to date with their annual eye exam?  no Who is the provider or what is the name of the office in which the pt attends annual eye exams? [facility]    Dental Screening: Recommended annual dental exams for proper oral hygiene  Community Resource Referral:  CRR required this visit?  No       Plan:    I have personally reviewed and addressed the Medicare Annual Wellness questionnaire and have noted the following in the patient's chart:  A. Medical and social history B. Use of alcohol, tobacco or illicit drugs  C. Current medications and supplements D. Functional ability and status E.  Nutritional status F.  Physical activity G. Advance directives H. List of other physicians I.  Hospitalizations, surgeries, and ER visits in previous 12 months J.  Vitals K. Screenings such as hearing and vision if needed, cognitive and depression L. Referrals and appointments   In addition, I have reviewed and discussed with patient certain preventive protocols, quality metrics, and best practice recommendations. A written personalized care plan for preventive services as well as general preventive health recommendations were provided to patient.   Signed,  Marin Robertsiffany Karlie Aung, LPN Nurse Health Advisor   Nurse Notes:none  I, as the supervising physician, have reviewed the nurse health advisor's Medicare wellness visit note for this patient and concur with the findings and recommendations listed above. Vonita MossMark Crissman, MD

## 2018-04-11 NOTE — Patient Instructions (Addendum)
Edward Yang , Thank you for taking time to come for your Medicare Wellness Visit. I appreciate your ongoing commitment to your health goals. Please review the following plan we discussed and let me know if I can assist you in the future.   Screening recommendations/referrals: Colonoscopy: cologuard completed 06/2016, due 06/2019 Recommended yearly ophthalmology/optometry visit for glaucoma screening and checkup Recommended yearly dental visit for hygiene and checkup  Vaccinations: Influenza vaccine: up to date Pneumococcal vaccine: up to date Tdap vaccine: up to date Shingles vaccine: completed series    Advanced directives: Advance directive discussed with you today. I have provided a copy for you to complete at home and have notarized. Once this is complete please bring a copy in to our office so we can scan it into your chart.   Please call if you would like to schedule an apt at the office with an interpretor.   Conditions/risks identified: please call and schedule diabetic eye exam with [facility].   Next appointment: Follow up in one year for your annual wellness exam.   Preventive Care 65 Years and Older, Male Preventive care refers to lifestyle choices and visits with your health care provider that can promote health and wellness. What does preventive care include?  A yearly physical exam. This is also called an annual well check.  Dental exams once or twice a year.  Routine eye exams. Ask your health care provider how often you should have your eyes checked.  Personal lifestyle choices, including:  Daily care of your teeth and gums.  Regular physical activity.  Eating a healthy diet.  Avoiding tobacco and drug use.  Limiting alcohol use.  Practicing safe sex.  Taking low doses of aspirin every day.  Taking vitamin and mineral supplements as recommended by your health care provider. What happens during an annual well check? The services and screenings  done by your health care provider during your annual well check will depend on your age, overall health, lifestyle risk factors, and family history of disease. Counseling  Your health care provider may ask you questions about your:  Alcohol use.  Tobacco use.  Drug use.  Emotional well-being.  Home and relationship well-being.  Sexual activity.  Eating habits.  History of falls.  Memory and ability to understand (cognition).  Work and work Astronomer. Screening  You may have the following tests or measurements:  Height, weight, and BMI.  Blood pressure.  Lipid and cholesterol levels. These may be checked every 5 years, or more frequently if you are over 11 years old.  Skin check.  Lung cancer screening. You may have this screening every year starting at age 70 if you have a 30-pack-year history of smoking and currently smoke or have quit within the past 15 years.  Fecal occult blood test (FOBT) of the stool. You may have this test every year starting at age 21.  Flexible sigmoidoscopy or colonoscopy. You may have a sigmoidoscopy every 5 years or a colonoscopy every 10 years starting at age 63.  Prostate cancer screening. Recommendations will vary depending on your family history and other risks.  Hepatitis C blood test.  Hepatitis B blood test.  Sexually transmitted disease (STD) testing.  Diabetes screening. This is done by checking your blood sugar (glucose) after you have not eaten for a while (fasting). You may have this done every 1-3 years.  Abdominal aortic aneurysm (AAA) screening. You may need this if you are a current or former smoker.  Osteoporosis.  You may be screened starting at age 15 if you are at high risk. Talk with your health care provider about your test results, treatment options, and if necessary, the need for more tests. Vaccines  Your health care provider may recommend certain vaccines, such as:  Influenza vaccine. This is recommended  every year.  Tetanus, diphtheria, and acellular pertussis (Tdap, Td) vaccine. You may need a Td booster every 10 years.  Zoster vaccine. You may need this after age 23.  Pneumococcal 13-valent conjugate (PCV13) vaccine. One dose is recommended after age 26.  Pneumococcal polysaccharide (PPSV23) vaccine. One dose is recommended after age 52. Talk to your health care provider about which screenings and vaccines you need and how often you need them. This information is not intended to replace advice given to you by your health care provider. Make sure you discuss any questions you have with your health care provider. Document Released: 03/20/2015 Document Revised: 11/11/2015 Document Reviewed: 12/23/2014 Elsevier Interactive Patient Education  2017 [employer].  Fall Prevention in the Home Falls can cause injuries. They can happen to people of all ages. There are many things you can do to make your home safe and to help prevent falls. What can I do on the outside of my home?  Regularly fix the edges of walkways and driveways and fix any cracks.  Remove anything that might make you trip as you walk through a door, such as a raised step or threshold.  Trim any bushes or trees on the path to your home.  Use bright outdoor lighting.  Clear any walking paths of anything that might make someone trip, such as rocks or tools.  Regularly check to see if handrails are loose or broken. Make sure that both sides of any steps have handrails.  Any raised decks and porches should have guardrails on the edges.  Have any leaves, snow, or ice cleared regularly.  Use sand or salt on walking paths during winter.  Clean up any spills in your garage right away. This includes oil or grease spills. What can I do in the bathroom?  Use night lights.  Install grab bars by the toilet and in the tub and shower. Do not use towel bars as grab bars.  Use non-skid mats or decals in the tub or shower.  If  you need to sit down in the shower, use a plastic, non-slip stool.  Keep the floor dry. Clean up any water that spills on the floor as soon as it happens.  Remove soap buildup in the tub or shower regularly.  Attach bath mats securely with double-sided non-slip rug tape.  Do not have throw rugs and other things on the floor that can make you trip. What can I do in the bedroom?  Use night lights.  Make sure that you have a light by your bed that is easy to reach.  Do not use any sheets or blankets that are too big for your bed. They should not hang down onto the floor.  Have a firm chair that has side arms. You can use this for support while you get dressed.  Do not have throw rugs and other things on the floor that can make you trip. What can I do in the kitchen?  Clean up any spills right away.  Avoid walking on wet floors.  Keep items that you use a lot in easy-to-reach places.  If you need to reach something above you, use a strong step stool  that has a grab bar.  Keep electrical cords out of the way.  Do not use floor polish or wax that makes floors slippery. If you must use wax, use non-skid floor wax.  Do not have throw rugs and other things on the floor that can make you trip. What can I do with my stairs?  Do not leave any items on the stairs.  Make sure that there are handrails on both sides of the stairs and use them. Fix handrails that are broken or loose. Make sure that handrails are as long as the stairways.  Check any carpeting to make sure that it is firmly attached to the stairs. Fix any carpet that is loose or worn.  Avoid having throw rugs at the top or bottom of the stairs. If you do have throw rugs, attach them to the floor with carpet tape.  Make sure that you have a light switch at the top of the stairs and the bottom of the stairs. If you do not have them, ask someone to add them for you. What else can I do to help prevent falls?  Wear shoes  that:  Do not have high heels.  Have rubber bottoms.  Are comfortable and fit you well.  Are closed at the toe. Do not wear sandals.  If you use a stepladder:  Make sure that it is fully opened. Do not climb a closed stepladder.  Make sure that both sides of the stepladder are locked into place.  Ask someone to hold it for you, if possible.  Clearly mark and make sure that you can see:  Any grab bars or handrails.  First and last steps.  Where the edge of each step is.  Use tools that help you move around (mobility aids) if they are needed. These include:  Canes.  Walkers.  Scooters.  Crutches.  Turn on the lights when you go into a dark area. Replace any light bulbs as soon as they burn out.  Set up your furniture so you have a clear path. Avoid moving your furniture around.  If any of your floors are uneven, fix them.  If there are any pets around you, be aware of where they are.  Review your medicines with your doctor. Some medicines can make you feel dizzy. This can increase your chance of falling. Ask your doctor what other things that you can do to help prevent falls. This information is not intended to replace advice given to you by your health care provider. Make sure you discuss any questions you have with your health care provider. Document Released: 12/18/2008 Document Revised: 07/30/2015 Document Reviewed: 03/28/2014 Elsevier Interactive Patient Education  2017 [employer]    ?? Medicare Wellness Visit? ?? ??? ? ??? ?????. ?? ??? ?? ???? ??? ??????. ??? ?? ??? ???? ?? ??? ?? ? ??? ??????.  ?? ?? / ?? : ?? ??? ?? : 2018 ? 4 ? 2 ?, ?? ?? ?? ??? ?? ? ????? ?? ?? / ?? ?? ?? ? ??? ?? ???? ?? ?? ??  ?? ?? : ????? ?? : ?? ?? ?? ?? : ?? Tdap ?? : ?? ?? ?? ?? : ?? ? ???  ?? ??? : ?? ??? ??? ?? ???. ?? ??? ??? ?? ? ? ??? ??? ????? ??? ?????. ? ??? ???? ???? ??? ??? ??? ??????. haemssi Medicare Wellness Visit-e waseo sigan-eul nae jusyeoseo  gamsahabnida. geongang mogpyoe daehan jisogjeog-in nolyeog-e gamsadeulibnida. non-uihan da-eum gyehoeg-eul geomtohago  hyanghu doum-eul deulil su issneunji allyeojusibsio.  simsa chucheon / chucheon : daejang naesigyeong geomsa : 2018 nyeon 4 wol 2 il, kollo gadeu wanlyo nognaejang geomsa mich geomjin-eul-wihan gwonjang angwa / geom-an bangmun wisaeng mich geomjin-eul wihae gwonjangdoeneun yeongan chigwa bangmun  yebang jeobjong : inpeulluenja baegsin : choesin pyelyeom gugyun baegsin : choesin Tdap baegsin : choesin daesang pojin baegsin : wanseong doen silijeu  gogeub jisimun : oneul gwihawa non-uihan sajeon jisiseo. naneun dangsin-i jib-eseo wanseong hal su issdolog sabon-eul jegonghaess-eumyeo gongjeung-eul bad-assseubnida. i jag-eob-i wanlyodoemyeon samusillo sabon-eul gajyeowa chateulo seukaenhasibsio.  ???? ?? ????? ???? ????? ??????.  ?? / ?? ?? : [facility]? ??? ?? ??? ???? ??????.  ?? ?? : ?? ?? ??? ?? 1 ? ? ?? ??.  ?? ?? 63 ? ??, ?? ?? ?? ? ??? ??? ???? ??? ??? ??? ?? ? ?? ?? ????? ??? ????. ?? ???? ??? ?????? ?? ?? ??. ??? ?? ?? ?????????. ?? ??? 1 ?? 1 ~ 2 ????. ???? ?? ??. ??? ?? ?? ??????? ???? ??????. ??? ??? ?? ?? ?? : ??? ?? ?? ??. ???? ?? ??. ????? ??. ??? ?? ??? ?????. ??? ?? ??. ??? ???? ??????. ?? ???? ???? ????. ?? ?? ???? ???? ??? ? ??? ??? ??. ?? ?? ?? ??? ??????? ?? ?? ????? ?? ?? ?? ??? ???? ???? ??? ? ??? ??, ???? ??, ?? ?? ?? ?? ? ?? ??? ?? ?????. ?? ??? ?? ?? ??? ???? ??? ?? ??? ? ? ???? ??? ??. ?? ??. ?? ??. ??? ??. ??? ??? ??. ???. ???. ??? ??. ??? ?? ?? (??). ?? ? ?? ??. ?? ??? ?? ??? ?? ????? ? ????. ??, ?? ? BMI ??. ??? ????? ??. 5 ??? ?? 50 ? ??? ?? ? ?? ?? ? ? ????. ?? ??. ?? ??. ?? ??? 30 ? ??? ?? ????? ?? 15 ? ??? ?? ? ?? ?? 55 ???? ????? ? ????. ??? ?? ?? ?? (FOBT). ?? 50 ???? ????? ? ????. ??? ?? ??? ??? ?? ?? ???. 5 ??? sigmoidoscopy ?? 50 ??? 10 ??? ?? ??? ????? ? ????. ??? ? ??. ?? ??? ?? ?? ? ?? ??? ?? ???  ? ????. C ? ?? ?? ??. B ? ?? ?? ??. ?? (STD) ??. ??? ??. ??? ??? ??? ?? ?? ? (???) ?? (???)? ???? ?????. 1 ~ 3 ???? ??? ?? ? ? ????. ?? ??? ??? (AAA) ??. ?? ?? ?? ??? ? ??? ??? ? ????. ????. ??? ?? ?? 70 ??? ????? ? ????. ?? ??, ?? ?? ? ??? ?? ?? ??? ???? ?? ?? ?? ??? ???? ??????. tong-yeogsawa hamkke samusil-eseo apateuleul yeyaghalyeomyeon jeonhwahasibsio.  jogeon / wiheom sigbyeol : [facility] dangnyobyeong angwa geomsaleul yocheonghago yeyaghasibsio.  da-eum yagsog : yeongan geongang geomjin-eul wihae 1 nyeon hu husog jochi.  yebang chilyo 63 se isang, namseong yebang gwanli lan geongang-gwa welbing-eul jeungjinsikil su-issneun laipeu seutail seontaeg mich geongang gwanli jegongjawaui bangmun-eul malhabnida. yebang chilyoeneun mueos-i pohamdoebnikka? maenyeon sinche geomsa. igeos-eul yeongan umul jeomgeom-ilagodohabnida. chigwa geomsaneun 1 nyeon-e 1 ~ 2 hoeibnida. ilsangjeog-in silyeog geomsa. eolmana jaju nun-eul geomsahaeyahaneunji uisa-ege mun-uihasibsio. da-eum-eul pohamhan gaein saenghwal seontaeg : chiawa ismom maeil gwanli. gyuchigjeog-in sinche hwaldong. geonganghansig-i yobeob. dambaewa yagmul sayong-eul pihasibsio. alkool Emerson Electricsayong jehan. anjeonhan seong-gwangyeleul silcheonhasibsio. maeil jeoyonglyang-ui aseupilin bog-yonghagi. geongang gwanli gong-geubjaga gwonjanghaneun bitamin mich minelal bochungje bog-yong. yeongan jeong-gi jeomgeom jung-eneun eotteohgedoebnikka? maenyeon geongang geomjin-eulbadneun dong-an geongang gwanli seobiseu jegongjaga suhaenghaneun seobiseu mich geomjin-eun yeonlyeong, jeonbanjeog-in geongang, saenghwal seubgwan wiheom yoso  mich gajog byeonglyeog-e ttala dallajibnida. sangdam gwihaui geongang gwanli seobiseu jegongjaga gwiha-e daehan jilmun-eul hal su issseubnida alkool sayong. dambae sayong. yagmul sayong. jeongseojeog anjeong. gajeong-gwa gwangyeui annyeong. seonghaeng-wi. sigseubgwan. pogpoui yeogsa. gieoggwa ihae  neunglyeog (inji). jag-eob mich jag-eob hwangyeong. sang-yeong da-eumgwa gat-eun teseuteu ttoneun cheugjeong-iiss-eul su issseubnida. sinjang, chejung mich BMI hyeol-ab. jijilgwa kolleseutelol suchi. 5 nyeonmada ttoneun 50 se isang-in gyeong-u deo jaju geomsa hal su issseubnida. pibu geomsa. pyeam geomjin. heub-yeon hoes-suga 30 paeg nyeon-igo hyeonjae heub-yeonhageona jinan 15 nyeon inaee geum-yeon han gyeong-u maenyeon 53 sebuteoi geomsaleulbad-eul su issseubnida. daebyeon-ui daebyeon jamhyeol geomsa (FOBT). maenyeon 48 sebuteoi geomsaleulbad-eul su issseubnida. yuyeonhan sigeu moido seukopeu ttoneun daejang naesigyeong. 5 semada sigmoidoscopy ttoneun 48 sebuteo 10 nyeonmada daejang naesigyeong geomsaleulbad-eul su issseubnida. jeonlibseon am geomjin. gwonjang sahang-eun gajog yeogsa mich gita wiheom-e ttala dallajil su issseubnida. C hyeong gan-yeom hyeol-aeg geomsa. B hyeong gan-yeom hyeol-aeg geomsa. seongbyeong (STD) geomsa. dangnyobyeong geomsa. igeos-eun dangsin-i handong-an meogji anh-eun hu (pododang) hyeoldang (pododang)eul hwag-inhayeo suhaengdoebnida. 1 ~ 3 nyeonmadai jag-eob-eul suhaeng hal su issseubnida. bogbu daedongmaeg dongmaeglyu (AAA) geomsa. hyeonjae ttoneun ijeon heub-yeonja in gyeong-ue pil-yohal su issseubnida. goldagongjeung. wiheom-i nop-eun gyeong-u 68 sebuteo geomsaleulbad-eul su issseubnida. geomsa gyeolgwa, chilyo obsyeon mich pil-yohan gyeong-u chuga geomsaui pil-yoseong-e daehae bogeon uilyo seobiseu jegongjawa sangdamhasibsio.

## 2018-07-09 ENCOUNTER — Other Ambulatory Visit: Payer: Self-pay

## 2018-07-09 ENCOUNTER — Ambulatory Visit (INDEPENDENT_AMBULATORY_CARE_PROVIDER_SITE_OTHER): Payer: Medicare HMO

## 2018-07-09 ENCOUNTER — Encounter: Payer: Self-pay | Admitting: Emergency Medicine

## 2018-07-09 ENCOUNTER — Ambulatory Visit
Admission: EM | Admit: 2018-07-09 | Discharge: 2018-07-09 | Disposition: A | Discharge status: Home / Self Care | Payer: Medicare HMO | Attending: Family Medicine | Admitting: Family Medicine

## 2018-07-09 DIAGNOSIS — T148XXA Other injury of unspecified body region, initial encounter: Secondary | ICD-10-CM

## 2018-07-09 DIAGNOSIS — M542 Cervicalgia: Secondary | ICD-10-CM | POA: Diagnosis not present

## 2018-07-09 DIAGNOSIS — W0110XA Fall on same level from slipping, tripping and stumbling with subsequent striking against unspecified object, initial encounter: Secondary | ICD-10-CM | POA: Diagnosis not present

## 2018-07-09 DIAGNOSIS — S199XXA Unspecified injury of neck, initial encounter: Secondary | ICD-10-CM | POA: Diagnosis not present

## 2018-07-09 NOTE — ED Triage Notes (Signed)
Patient states that he fell and hit his forehead on the ground outside a store this morning.  Patient has laceration to his forehead.

## 2018-07-09 NOTE — Discharge Instructions (Signed)
Xray negative for fracture.  Tylenol as needed for pain.  Take care  Dr. Adriana Simas

## 2018-07-09 NOTE — ED Provider Notes (Signed)
[facility]    CSN: [id number] Arrival date & time: 07/09/18  1602  History   Chief Complaint Chief Complaint  Patient presents with  . Facial Laceration   HPI  68 year old male presents with multiple facial abrasions after suffering a fall today.  Patient reports that he tripped and fell outside of a store this morning.  When he fell, he hit his forehead and face on the ground.  He suffered multiple facial abrasions.  There are no discrete facial lacerations.  Patient reports he has had some neck pain.  He reports pain at the site of 1 of the abrasions above the right eye.  No vision changes.  No reports of loss of consciousness.  Patient's pain is mild currently.  Bleeding is well controlled.  No other associated symptoms.  No other complaints.  Hx reviewed and updated as below. Past Medical History:  Diagnosis Date  . Diabetes mellitus without complication (HCC)   . Hyperlipidemia   . Hypertension    Patient Active Problem List   Diagnosis Date Noted  . Advanced care planning/counseling discussion 09/12/2016  . BPH (benign prostatic hyperplasia) 09/12/2016  . Obesity, diabetes, and hypertension syndrome (HCC)   . Hypertension   . Hyperlipidemia    Home Medications    Prior to Admission medications   Medication Sig Start Date End Date Taking? Authorizing Provider  glipiZIDE (GLUCOTROL) 5 MG tablet Take 1 tablet (5 mg total) by mouth daily before breakfast. 09/25/17  Yes Crissman, Redge Gainer, MD  lisinopril (PRINIVIL,ZESTRIL) 10 MG tablet Take 1 tablet (10 mg total) by mouth daily. 09/25/17  Yes Steele Sizer, MD  metFORMIN (GLUCOPHAGE) 500 MG tablet Take 1 tablet (500 mg total) by mouth 2 (two) times daily with a meal. 09/25/17  Yes Crissman, Redge Gainer, MD  pravastatin (PRAVACHOL) 40 MG tablet Take 1 tablet (40 mg total) by mouth daily. 09/25/17  Yes Crissman, Redge Gainer, MD  sildenafil (REVATIO) 20 MG tablet Take 1-5 tablets (20-100 mg total) by mouth as needed. 01/01/18   Yes Steele Sizer, MD    Family History Family History  Problem Relation Age of Onset  . Healthy Mother   . Healthy Father     Social History Social History   Tobacco Use  . Smoking status: Former Smoker    Last attempt to quit: 11/03/1994    Years since quitting: 23.6  . Smokeless tobacco: Never Used  Substance Use Topics  . Alcohol use: No    Alcohol/week: 0.0 standard drinks  . Drug use: No     Allergies   Patient has no known allergies.   Review of Systems Review of Systems  Eyes: Negative for visual disturbance.  Musculoskeletal: Positive for neck pain.  Skin: Positive for wound.   Physical Exam Triage Vital Signs ED Triage Vitals  Enc Vitals Group     BP 07/09/18 1620 126/68     Pulse Rate 07/09/18 1620 89     Resp 07/09/18 1620 16     Temp 07/09/18 1620 97.9 F (36.6 C)     Temp Source 07/09/18 1620 Oral     SpO2 07/09/18 1620 96 %     Weight 07/09/18 1617 150 lb (68 kg)     Height 07/09/18 1617  (1.778 m)     Head Circumference --      Peak Flow --      Pain Score 07/09/18 1617 0     Pain Loc --  Pain Edu? --      Excl. in GC? --    Updated Vital Signs BP 126/68 (BP Location: Left Arm)   Pulse 89   Temp 97.9 F (36.6 C) (Oral)   Resp 16   Ht 5\' 10"  (1.778 m)   Wt 68 kg   SpO2 96%   BMI 21.52 kg/m   Visual Acuity Right Eye Distance:   Left Eye Distance:   Bilateral Distance:    Right Eye Near:   Left Eye Near:    Bilateral Near:     Physical Exam Constitutional:      General: He is not in acute distress.    Appearance: He is normal weight.  HENT:     Head:      Comments: Superficial abrasions noted at the labeled locations.  Discrete lacerations. Eyes:     Extraocular Movements: Extraocular movements intact.     Conjunctiva/sclera: Conjunctivae normal.     Pupils: Pupils are equal, round, and reactive to light.  Neck:     Comments: Patient endorsing neck pain.  No discrete areas of tenderness. Cardiovascular:      Rate and Rhythm: Normal rate and regular rhythm.  Pulmonary:     Effort: Pulmonary effort is normal.     Breath sounds: Normal breath sounds.  Neurological:     General: No focal deficit present.     Mental Status: He is alert.     Cranial Nerves: No cranial nerve deficit.     Motor: No weakness.  Psychiatric:        Mood and Affect: Mood normal.        Behavior: Behavior normal.    UC Treatments / Results  Labs (all labs ordered are listed, but only abnormal results are displayed) Labs Reviewed - No data to display  EKG None  Radiology Dg Cervical Spine Complete  Result Date: 07/09/2018 CLINICAL DATA:  Tripped over a stump and fell today, pain at RIGHT lower posterolateral neck into RIGHT shoulder, abrasions to RIGHT face and head EXAM: CERVICAL SPINE - COMPLETE 4+ VIEW COMPARISON:  None FINDINGS: Slight reversal of cervical lordosis question muscle spasm. Prevertebral soft tissues normal thickness. Disc space narrowing and endplate spur formation at C5-C6 and C6-C7. Vertebral body heights maintained without fracture or subluxation. No bone destruction. Small uncovertebral spurs are noted at the C6-C7 neural foramina bilaterally. Narrowing of the RIGHT C3-C4 neural foramen is seen. C1-C2 alignment normal. Lung apices clear. 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 Small to vertebral spurs bilaterally at C6-C7 with RIGHT neural foraminal narrowing at C3-C4. Electronically Signed   By: Ulyses SouthwardMark  Boles M.D.   On: 07/09/2018 17:46    Procedures Procedures (including critical care time)  Medications Ordered in UC Medications - No data to display  Initial Impression / Assessment and Plan / UC Course  I have reviewed the triage vital signs and the nursing notes.  Pertinent labs & imaging results that were available during my care of the patient were reviewed by me and considered in my medical decision making (see chart for details).     68 year old male presents for evaluation after suffering a fall.  Patient has multiple abrasions.  No lacerations.  Cervical spine x-ray was obtained given reports of neck pain.  No fracture.  No indications for CT imaging.  Patient has a normal neurological exam.  Tylenol as needed.  Supportive care.  Final Clinical Impressions(s) / UC Diagnoses   Final  diagnoses:  Abrasion  Neck pain     Discharge Instructions     Xray negative for fracture.  Tylenol as needed for pain.  Take care  Dr. Adriana Simas    ED Prescriptions    None     Controlled Substance Prescriptions [place] Controlled Substance Registry consulted? Not Applicable   Tommie Sams, DO 07/09/18 [id number]

## 2018-10-01 ENCOUNTER — Encounter: Payer: Medicare HMO | Admitting: Family Medicine

## 2018-11-04 ENCOUNTER — Encounter: Payer: Self-pay | Admitting: Nurse Practitioner

## 2018-11-07 ENCOUNTER — Encounter: Payer: Medicare HMO | Admitting: Nurse Practitioner

## 2018-11-08 DIAGNOSIS — H43813 Vitreous degeneration, bilateral: Secondary | ICD-10-CM | POA: Diagnosis not present

## 2018-11-18 LAB — HM DIABETES EYE EXAM

## 2018-11-28 ENCOUNTER — Ambulatory Visit (INDEPENDENT_AMBULATORY_CARE_PROVIDER_SITE_OTHER): Payer: Medicare HMO | Admitting: Nurse Practitioner

## 2018-11-28 ENCOUNTER — Other Ambulatory Visit: Payer: Self-pay

## 2018-11-28 ENCOUNTER — Encounter: Payer: Self-pay | Admitting: Nurse Practitioner

## 2018-11-28 VITALS — BP 128/73 | HR 84 | Temp 98.1°F | Ht 62.0 in | Wt 153.0 lb

## 2018-11-28 DIAGNOSIS — I10 Essential (primary) hypertension: Secondary | ICD-10-CM | POA: Diagnosis not present

## 2018-11-28 DIAGNOSIS — E1169 Type 2 diabetes mellitus with other specified complication: Secondary | ICD-10-CM

## 2018-11-28 DIAGNOSIS — E119 Type 2 diabetes mellitus without complications: Secondary | ICD-10-CM | POA: Diagnosis not present

## 2018-11-28 DIAGNOSIS — Z0000 Encounter for general adult medical examination without abnormal findings: Secondary | ICD-10-CM

## 2018-11-28 DIAGNOSIS — E1159 Type 2 diabetes mellitus with other circulatory complications: Secondary | ICD-10-CM | POA: Diagnosis not present

## 2018-11-28 DIAGNOSIS — R809 Proteinuria, unspecified: Secondary | ICD-10-CM | POA: Insufficient documentation

## 2018-11-28 DIAGNOSIS — Z125 Encounter for screening for malignant neoplasm of prostate: Secondary | ICD-10-CM | POA: Diagnosis not present

## 2018-11-28 DIAGNOSIS — E1129 Type 2 diabetes mellitus with other diabetic kidney complication: Secondary | ICD-10-CM | POA: Diagnosis not present

## 2018-11-28 DIAGNOSIS — E785 Hyperlipidemia, unspecified: Secondary | ICD-10-CM | POA: Diagnosis not present

## 2018-11-28 LAB — BAYER DCA HB A1C WAIVED: HB A1C (BAYER DCA - WAIVED): 7 % — ABNORMAL HIGH (ref <7.0)

## 2018-11-28 LAB — MICROALBUMIN, URINE WAIVED
Creatinine, Urine Waived: 50 mg/dL (ref 10–300)
Microalb, Ur Waived: 10 mg/L (ref 0–19)
Microalb/Creat Ratio: <30 mg/g (ref <30)

## 2018-11-28 MED ORDER — LISINOPRIL 10 MG PO TABS: 10.0000 mg | ORAL_TABLET | Freq: Every day | ORAL | 4 refills | Status: AC

## 2018-11-28 MED ORDER — PRAVASTATIN SODIUM 40 MG PO TABS: 40.0000 mg | ORAL_TABLET | Freq: Every day | ORAL | 4 refills | Status: AC

## 2018-11-28 MED ORDER — METFORMIN HCL 500 MG PO TABS: 500.0000 mg | ORAL_TABLET | Freq: Two times a day (BID) | ORAL | 4 refills | Status: DC

## 2018-11-28 MED ORDER — GLIPIZIDE 5 MG PO TABS: 5.0000 mg | ORAL_TABLET | Freq: Every day | ORAL | 4 refills | Status: AC

## 2018-11-28 NOTE — Progress Notes (Signed)
BP 128/73   Pulse 84   Temp 98.1 F (36.7 C) (Oral)   Ht 5\' 2"  (1.575 m)   Wt 153 lb (69.4 kg)   SpO2 96%   BMI 27.98 kg/m    Subjective:    Patient ID: Edward Yang, male    DOB: 04/18/1950, 68 y.o.   MRN: [id number]  HPI: Edward Yang is a 68 y.o. male presenting on 11/28/2018 for comprehensive medical examination. Current medical complaints include:none  He currently lives with: wife Interim Problems from his last visit: no   DIABETES Last A1C 7.2%.  Continues on Metformin and Glipizide.   Hypoglycemic episodes:no Polydipsia/polyuria: no Visual disturbance: no Chest pain: no Paresthesias: no Glucose Monitoring: no  Accucheck frequency: Not Checking  Fasting glucose:  Post prandial:  Evening:  Before meals: Taking Insulin?: no  Long acting insulin:  Short acting insulin: Blood Pressure Monitoring: not checking Retinal Examination: Up to Date Foot Exam: Up to Date Pneumovax: Up to Date Influenza: Up to Date Aspirin: no   HYPERTENSION / HYPERLIPIDEMIA Continues on Pravastatin and Lisinopril. Satisfied with current treatment? yes Duration of hypertension: chronic BP monitoring frequency: not checking BP range:  BP medication side effects: no Duration of hyperlipidemia: chronic Cholesterol medication side effects: no Cholesterol supplements: none Medication compliance: good compliance Aspirin: no Recent stressors: no Recurrent headaches: no Visual changes: no Palpitations: no Dyspnea: no Chest pain: no Lower extremity edema: no Dizzy/lightheaded: no  Functional Status Survey: Is the patient deaf or have difficulty hearing?: No Does the patient have difficulty seeing, even when wearing glasses/contacts?: No Does the patient have difficulty concentrating, remembering, or making decisions?: No Does the patient have difficulty walking or climbing stairs?: No Does the patient have difficulty dressing or bathing?: No Does the patient have difficulty doing  errands alone such as visiting a doctor's office or shopping?: No  FALL RISK: Fall Risk  04/11/2018 01/01/2018 09/25/2017 07/12/2017 03/27/2017  Falls in the past year? 0 No No No No  Number falls in past yr: 0 - - - -    Depression Screen Depression screen [facility] 2/9 11/28/2018 04/11/2018 09/25/2017 07/12/2017 03/27/2017  Decreased Interest 0 0 0 0 0  Down, Depressed, Hopeless 0 0 0 0 0  PHQ - 2 Score 0 0 0 0 0  Altered sleeping 0 - - - -  Tired, decreased energy 0 - - - -  Change in appetite 0 - - - -  Feeling bad or failure about yourself  0 - - - -  Trouble concentrating 0 - - - -  Moving slowly or fidgety/restless 0 - - - -  Suicidal thoughts 0 - - - -  PHQ-9 Score 0 - - - -  Difficult doing work/chores Not difficult at all - - - -    Advanced Directives <no information>  Past Medical History:  Past Medical History:  Diagnosis Date  . Diabetes mellitus without complication (HCC)   . Hyperlipidemia   . Hypertension     Surgical History:  History reviewed. No pertinent surgical history.  Medications:  Current Outpatient Medications on File Prior to Visit  Medication Sig  . sildenafil (REVATIO) 20 MG tablet Take 1-5 tablets (20-100 mg total) by mouth as needed.   No current facility-administered medications on file prior to visit.     Allergies:  No Known Allergies  Social History:  Social History   Socioeconomic History  . Marital status: Married    Spouse name: Not on file  .  Number of children: Not on file  . Years of education: Not on file  . Highest education level: Some college, no degree  Occupational History  . Occupation: [employer] store     Comment: full time   Social Needs  . Financial resource strain: Not hard at all  . Food insecurity    Worry: Never true    Inability: Never true  . Transportation needs    Medical: No    Non-medical: No  Tobacco Use  . Smoking status: Former Smoker    Quit date: 11/03/1994    Years since quitting: 24.0  .  Smokeless tobacco: Never Used  Substance and Sexual Activity  . Alcohol use: No    Alcohol/week: 0.0 standard drinks  . Drug use: No  . Sexual activity: Not on file  Lifestyle  . Physical activity    Days per week: 4 days    Minutes per session: 60 min  . Stress: Not at all  Relationships  . Social connections    Talks on phone: More than three times a week    Gets together: Three times a week    Attends religious service: More than 4 times per year    Active member of club or organization: No    Attends meetings of clubs or organizations: Never    Relationship status: Married  . Intimate partner violence    Fear of current or ex partner: No    Emotionally abused: No    Physically abused: No    Forced sexual activity: No  Other Topics Concern  . Not on file  Social History Narrative  . Not on file   Social History   Tobacco Use  Smoking Status Former Smoker  . Quit date: 11/03/1994  . Years since quitting: 24.0  Smokeless Tobacco Never Used   Social History   Substance and Sexual Activity  Alcohol Use No  . Alcohol/week: 0.0 standard drinks    Family History:  Family History  Problem Relation Age of Onset  . Healthy Mother   . Healthy Father     Past medical history, surgical history, medications, allergies, family history and social history reviewed with patient today and changes made to appropriate areas of the chart.   Review of Systems - negative All other ROS negative except what is listed above and in the HPI.      Objective:    BP 128/73   Pulse 84   Temp 98.1 F (36.7 C) (Oral)   Ht 5\' 2"  (1.575 m)   Wt 153 lb (69.4 kg)   SpO2 96%   BMI 27.98 kg/m   Wt Readings from Last 3 Encounters:  11/28/18 153 lb (69.4 kg)  07/09/18 150 lb (68 kg)  04/11/18 155 lb 11.2 oz (70.6 kg)    Physical Exam Vitals signs and nursing note reviewed.  Constitutional:      General: He is awake. He is not in acute distress.    Appearance: He is well-developed  and overweight. He is not ill-appearing.  HENT:     Head: Normocephalic and atraumatic.     Right Ear: Hearing, tympanic membrane, ear canal and external ear normal. No drainage.     Left Ear: Hearing, tympanic membrane, ear canal and external ear normal. No drainage.     Nose: Nose normal.     Mouth/Throat:     Mouth: Mucous membranes are moist.     Pharynx: Uvula midline.  Eyes:     General:  Lids are normal.        Right eye: No discharge.        Left eye: No discharge.     Extraocular Movements: Extraocular movements intact.     Conjunctiva/sclera: Conjunctivae normal.     Pupils: Pupils are equal, round, and reactive to light.     Visual Fields: Right eye visual fields normal and left eye visual fields normal.  Neck:     Musculoskeletal: Normal range of motion and neck supple.     Thyroid: No thyromegaly.     Vascular: No carotid bruit.     Trachea: Trachea normal.  Cardiovascular:     Rate and Rhythm: Normal rate and regular rhythm.     Heart sounds: Normal heart sounds, S1 normal and S2 normal. No murmur. No gallop.   Pulmonary:     Effort: Pulmonary effort is normal. No accessory muscle usage or respiratory distress.     Breath sounds: Normal breath sounds.  Abdominal:     General: Bowel sounds are normal.     Palpations: Abdomen is soft. There is no hepatomegaly or splenomegaly.     Tenderness: There is no abdominal tenderness.     Hernia: There is no hernia in the left inguinal area or right inguinal area.  Genitourinary:    Penis: Normal.      Scrotum/Testes: Normal.     Prostate: Normal.     Rectum: Normal.  Musculoskeletal: Normal range of motion.     Right lower leg: No edema.     Left lower leg: No edema.  Lymphadenopathy:     Head:     Right side of head: No submental, submandibular, tonsillar, preauricular or posterior auricular adenopathy.     Left side of head: No submental, submandibular, tonsillar, preauricular or posterior auricular adenopathy.      Cervical: No cervical adenopathy.  Skin:    General: Skin is warm and dry.     Capillary Refill: Capillary refill takes less than 2 seconds.     Findings: No rash.  Neurological:     Mental Status: He is alert and oriented to person, place, and time.     Cranial Nerves: Cranial nerves are intact.     Gait: Gait is intact.     Deep Tendon Reflexes: Reflexes are normal and symmetric.     Reflex Scores:      Brachioradialis reflexes are 2+ on the right side and 2+ on the left side.      Patellar reflexes are 2+ on the right side and 2+ on the left side. Psychiatric:        Attention and Perception: Attention normal.        Mood and Affect: Mood normal.        Speech: Speech normal.        Behavior: Behavior normal. Behavior is cooperative.        Thought Content: Thought content normal.        Judgment: Judgment normal.    Diabetic Foot Exam - Simple   Simple Foot Form Visual Inspection No deformities, no ulcerations, no other skin breakdown bilaterally: Yes Sensation Testing Intact to touch and monofilament testing bilaterally: Yes Pulse Check Posterior Tibialis and Dorsalis pulse intact bilaterally: Yes Comments     Results for orders placed or performed in visit on 11/28/18  Bayer DCA Hb A1c Waived  Result Value Ref Range   HB A1C (BAYER DCA - WAIVED) 7.0 (H) <7.0 %  Microalbumin, Urine Waived  Result Value Ref Range   Microalb, Ur Waived 10 0 - 19 mg/L   Creatinine, Urine Waived 50 10 - 300 mg/dL   Microalb/Creat Ratio <30 <30 mg/g      Assessment & Plan:   Problem List Items Addressed This Visit      Cardiovascular and Mediastinum   Hypertension    Chronic, ongoing with BP below goal today.  Continue current medication regimen and adjust as needed.  Refills sent.      Relevant Medications   pravastatin (PRAVACHOL) 40 MG tablet   lisinopril (ZESTRIL) 10 MG tablet   Other Relevant Orders   Comprehensive metabolic panel     Endocrine   Hyperlipidemia  associated with type 2 diabetes mellitus (HCC)    Chronic, ongoing.  Continue current medication regimen and adjust as needed.  Obtain lipid and CMP today.  Refills sent on medication.      Relevant Medications   pravastatin (PRAVACHOL) 40 MG tablet   metFORMIN (GLUCOPHAGE) 500 MG tablet   lisinopril (ZESTRIL) 10 MG tablet   glipiZIDE (GLUCOTROL) 5 MG tablet   Other Relevant Orders   Comprehensive metabolic panel   Lipid Panel w/o Chol/HDL Ratio   Type 2 diabetes mellitus without complications (HCC)    Chronic, ongoing with A1C 7.0%, urine micro ALB 10 and A:C <30.  Continue current medication regimen, refills sent.  A1C with downward trend.  Recommend he check BS at home daily.  Return to office in 3 months.      Relevant Medications   pravastatin (PRAVACHOL) 40 MG tablet   metFORMIN (GLUCOPHAGE) 500 MG tablet   lisinopril (ZESTRIL) 10 MG tablet   glipiZIDE (GLUCOTROL) 5 MG tablet   Other Relevant Orders   Bayer DCA Hb A1c Waived (Completed)   Microalbumin, Urine Waived (Completed)    Other Visit Diagnoses    Encounter for annual physical exam    -  Primary   Relevant Orders   CBC with Differential/Platelet   TSH   PSA      Discussed aspirin prophylaxis for myocardial infarction prevention and decision was that we recommended ASA, and patient refused  LABORATORY TESTING:  Health maintenance labs ordered today as discussed above.   The natural history of prostate cancer and ongoing controversy regarding screening and potential treatment outcomes of prostate cancer has been discussed with the patient. The meaning of a false positive PSA and a false negative PSA has been discussed. He indicates understanding of the limitations of this screening test and wishes  to proceed with screening PSA testing.   IMMUNIZATIONS:   - Tdap: Tetanus vaccination status reviewed: last tetanus booster within 10 years. - Influenza: Up to date - Pneumovax: Up to date - Prevnar: Up to date -  Zostavax vaccine: Up to date  SCREENING: - Colonoscopy: Up to date  Discussed with patient purpose of the colonoscopy is to detect colon cancer at curable precancerous or early stages   - AAA Screening: Not applicable  -Hearing Test: Not applicable  -Spirometry: Not applicable   PATIENT COUNSELING:    Sexuality: Discussed sexually transmitted diseases, partner selection, use of condoms, avoidance of unintended pregnancy  and contraceptive alternatives.   Advised to avoid cigarette smoking.  I discussed with the patient that most people either abstain from alcohol or drink within safe limits (<=14/week and <=4 drinks/occasion for males, <=7/weeks and <= 3 drinks/occasion for females) and that the risk for alcohol disorders and other health effects rises proportionally with the number of drinks  per week and how often a drinker exceeds daily limits.  Discussed cessation/primary prevention of drug use and availability of treatment for abuse.   Diet: Encouraged to adjust caloric intake to maintain  or achieve ideal body weight, to reduce intake of dietary saturated fat and total fat, to limit sodium intake by avoiding high sodium foods and not adding table salt, and to maintain adequate dietary potassium and calcium preferably from fresh fruits, vegetables, and low-fat dairy products.    stressed the importance of regular exercise  Injury prevention: Discussed safety belts, safety helmets, smoke detector, smoking near bedding or upholstery.   Dental health: Discussed importance of regular tooth brushing, flossing, and dental visits.   Follow up plan: NEXT PREVENTATIVE PHYSICAL DUE IN 1 YEAR. Return in about 3 months (around 02/27/2019) for T2DM, HTN/HLD.

## 2018-11-28 NOTE — Assessment & Plan Note (Addendum)
Chronic, ongoing with A1C 7.0%, urine micro ALB 10 and A:C <30.  Continue current medication regimen, refills sent.  A1C with downward trend.  Recommend he check BS at home daily.  Return to office in 3 months.

## 2018-11-28 NOTE — Assessment & Plan Note (Signed)
Chronic, ongoing.  Continue current medication regimen and adjust as needed.  Obtain lipid and CMP today.  Refills sent on medication.

## 2018-11-28 NOTE — Patient Instructions (Signed)
Carbohydrate Counting for Diabetes Mellitus, Adult  Carbohydrate counting is a method of keeping track of how many carbohydrates you eat. Eating carbohydrates naturally increases the amount of sugar (glucose) in the blood. Counting how many carbohydrates you eat helps keep your blood glucose within normal limits, which helps you manage your diabetes (diabetes mellitus). It is important to know how many carbohydrates you can safely have in each meal. This is different for every person. A diet and nutrition specialist (registered dietitian) can help you make a meal plan and calculate how many carbohydrates you should have at each meal and snack. Carbohydrates are found in the following foods:  Grains, such as breads and cereals.  Dried beans and soy products.  Starchy vegetables, such as potatoes, peas, and corn.  Fruit and fruit juices.  Milk and yogurt.  Sweets and snack foods, such as cake, cookies, candy, chips, and soft drinks. How do I count carbohydrates? There are two ways to count carbohydrates in food. You can use either of the methods or a combination of both. Reading "Nutrition Facts" on packaged food The "Nutrition Facts" list is included on the labels of almost all packaged foods and beverages in the [place] It includes:  The serving size.  Information about nutrients in each serving, including the grams (g) of carbohydrate per serving. To use the "Nutrition Facts":  Decide how many servings you will have.  Multiply the number of servings by the number of carbohydrates per serving.  The resulting number is the total amount of carbohydrates that you will be having. Learning standard serving sizes of other foods When you eat carbohydrate foods that are not packaged or do not include "Nutrition Facts" on the label, you need to measure the servings in order to count the amount of carbohydrates:  Measure the foods that you will eat with a food scale or measuring cup, if needed.   Decide how many standard-size servings you will eat.  Multiply the number of servings by 15. Most carbohydrate-rich foods have about 15 g of carbohydrates per serving. ? For example, if you eat 8 oz (170 g) of strawberries, you will have eaten 2 servings and 30 g of carbohydrates (2 servings x 15 g = 30 g).  For foods that have more than one food mixed, such as soups and casseroles, you must count the carbohydrates in each food that is included. The following list contains standard serving sizes of common carbohydrate-rich foods. Each of these servings has about 15 g of carbohydrates:   hamburger bun or  English muffin.   oz (15 mL) syrup.   oz (14 g) jelly.  1 slice of bread.  1 six-inch tortilla.  3 oz (85 g) cooked rice or pasta.  4 oz (113 g) cooked dried beans.  4 oz (113 g) starchy vegetable, such as peas, corn, or potatoes.  4 oz (113 g) hot cereal.  4 oz (113 g) mashed potatoes or  of a large baked potato.  4 oz (113 g) canned or frozen fruit.  4 oz (120 mL) fruit juice.  4-6 crackers.  6 chicken nuggets.  6 oz (170 g) unsweetened dry cereal.  6 oz (170 g) plain fat-free yogurt or yogurt sweetened with artificial sweeteners.  8 oz (240 mL) milk.  8 oz (170 g) fresh fruit or one small piece of fruit.  24 oz (680 g) popped popcorn. Example of carbohydrate counting Sample meal  3 oz (85 g) chicken breast.  6 oz (170 g)   brown rice.  4 oz (113 g) corn.  8 oz (240 mL) milk.  8 oz (170 g) strawberries with sugar-free whipped topping. Carbohydrate calculation 1. Identify the foods that contain carbohydrates: ? Rice. ? Corn. ? Milk. ? Strawberries. 2. Calculate how many servings you have of each food: ? 2 servings rice. ? 1 serving corn. ? 1 serving milk. ? 1 serving strawberries. 3. Multiply each number of servings by 15 g: ? 2 servings rice x 15 g = 30 g. ? 1 serving corn x 15 g = 15 g. ? 1 serving milk x 15 g = 15 g. ? 1 serving  strawberries x 15 g = 15 g. 4. Add together all of the amounts to find the total grams of carbohydrates eaten: ? 30 g + 15 g + 15 g + 15 g = 75 g of carbohydrates total. Summary  Carbohydrate counting is a method of keeping track of how many carbohydrates you eat.  Eating carbohydrates naturally increases the amount of sugar (glucose) in the blood.  Counting how many carbohydrates you eat helps keep your blood glucose within normal limits, which helps you manage your diabetes.  A diet and nutrition specialist (registered dietitian) can help you make a meal plan and calculate how many carbohydrates you should have at each meal and snack. This information is not intended to replace advice given to you by your health care provider. Make sure you discuss any questions you have with your health care provider. Document Released: 02/21/2005 Document Revised: 09/15/2016 Document Reviewed: 08/05/2015 Elsevier Patient Education  2020 [employer]  

## 2018-11-28 NOTE — Assessment & Plan Note (Signed)
Chronic, ongoing with BP below goal today.  Continue current medication regimen and adjust as needed.  Refills sent.

## 2018-11-29 ENCOUNTER — Encounter: Payer: Self-pay | Admitting: Nurse Practitioner

## 2018-11-29 LAB — COMPREHENSIVE METABOLIC PANEL
ALT: 43 IU/L (ref 0–44)
AST: 27 IU/L (ref 0–40)
Albumin/Globulin Ratio: 1.7 (ref 1.2–2.2)
Albumin: 4.7 g/dL (ref 3.8–4.8)
Alkaline Phosphatase: 101 IU/L (ref 39–117)
BUN/Creatinine Ratio: 16 (ref 10–24)
BUN: 15 mg/dL (ref 8–27)
Bilirubin Total: 0.6 mg/dL (ref 0.0–1.2)
CO2: 21 mmol/L (ref 20–29)
Calcium: 9.7 mg/dL (ref 8.6–10.2)
Chloride: 102 mmol/L (ref 96–106)
Creatinine, Ser: 0.92 mg/dL (ref 0.76–1.27)
GFR calc Af Amer: 97 mL/min/{1.73_m2} (ref >59)
GFR calc non Af Amer: 84 mL/min/{1.73_m2} (ref >59)
Globulin, Total: 2.7 g/dL (ref 1.5–4.5)
Glucose: 136 mg/dL — ABNORMAL HIGH (ref 65–99)
Potassium: 4.2 mmol/L (ref 3.5–5.2)
Sodium: 140 mmol/L (ref 134–144)
Total Protein: 7.4 g/dL (ref 6.0–8.5)

## 2018-11-29 LAB — CBC WITH DIFFERENTIAL/PLATELET
Basophils Absolute: 0 10*3/uL (ref 0.0–0.2)
Basos: 0 %
EOS (ABSOLUTE): 0.2 10*3/uL (ref 0.0–0.4)
Eos: 2 %
Hematocrit: 42.2 % (ref 37.5–51.0)
Hemoglobin: 14.9 g/dL (ref 13.0–17.7)
Immature Grans (Abs): 0 10*3/uL (ref 0.0–0.1)
Immature Granulocytes: 0 %
Lymphocytes Absolute: 2.3 10*3/uL (ref 0.7–3.1)
Lymphs: 25 %
MCH: 32.3 pg (ref 26.6–33.0)
MCHC: 35.3 g/dL (ref 31.5–35.7)
MCV: 92 fL (ref 79–97)
Monocytes Absolute: 0.5 10*3/uL (ref 0.1–0.9)
Monocytes: 6 %
Neutrophils Absolute: 6 10*3/uL (ref 1.4–7.0)
Neutrophils: 67 %
Platelets: 142 10*3/uL — ABNORMAL LOW (ref 150–450)
RBC: 4.61 x10E6/uL (ref 4.14–5.80)
RDW: 12.4 % (ref 11.6–15.4)
WBC: 9 10*3/uL (ref 3.4–10.8)

## 2018-11-29 LAB — PSA: Prostate Specific Ag, Serum: 0.6 ng/mL (ref 0.0–4.0)

## 2018-11-29 LAB — LIPID PANEL W/O CHOL/HDL RATIO
Cholesterol, Total: 167 mg/dL (ref 100–199)
HDL: 61 mg/dL (ref >39)
LDL Chol Calc (NIH): 67 mg/dL (ref 0–99)
Triglycerides: 243 mg/dL — ABNORMAL HIGH (ref 0–149)
VLDL Cholesterol Cal: 39 mg/dL (ref 5–40)

## 2018-11-29 LAB — TSH: TSH: 1.49 u[IU]/mL (ref 0.450–4.500)

## 2018-11-29 NOTE — Progress Notes (Signed)
Letter out for labs 

## 2019-02-27 ENCOUNTER — Ambulatory Visit: Payer: Medicare HMO | Admitting: Nurse Practitioner

## 2019-03-16 ENCOUNTER — Encounter: Payer: Self-pay | Admitting: Nurse Practitioner

## 2019-03-16 DIAGNOSIS — D696 Thrombocytopenia, unspecified: Secondary | ICD-10-CM | POA: Insufficient documentation

## 2019-03-18 ENCOUNTER — Encounter: Payer: Self-pay | Admitting: Nurse Practitioner

## 2019-03-18 ENCOUNTER — Ambulatory Visit (INDEPENDENT_AMBULATORY_CARE_PROVIDER_SITE_OTHER): Payer: Medicare HMO | Admitting: Nurse Practitioner

## 2019-03-18 ENCOUNTER — Other Ambulatory Visit: Payer: Self-pay

## 2019-03-18 VITALS — BP 118/68 | HR 90 | Temp 98.3°F

## 2019-03-18 DIAGNOSIS — E1169 Type 2 diabetes mellitus with other specified complication: Secondary | ICD-10-CM | POA: Diagnosis not present

## 2019-03-18 DIAGNOSIS — E785 Hyperlipidemia, unspecified: Secondary | ICD-10-CM | POA: Diagnosis not present

## 2019-03-18 DIAGNOSIS — E119 Type 2 diabetes mellitus without complications: Secondary | ICD-10-CM

## 2019-03-18 DIAGNOSIS — I10 Essential (primary) hypertension: Secondary | ICD-10-CM | POA: Diagnosis not present

## 2019-03-18 DIAGNOSIS — D696 Thrombocytopenia, unspecified: Secondary | ICD-10-CM | POA: Diagnosis not present

## 2019-03-18 DIAGNOSIS — E1159 Type 2 diabetes mellitus with other circulatory complications: Secondary | ICD-10-CM

## 2019-03-18 DIAGNOSIS — I152 Hypertension secondary to endocrine disorders: Secondary | ICD-10-CM

## 2019-03-18 LAB — MICROALBUMIN, URINE WAIVED
Creatinine, Urine Waived: 50 mg/dL (ref 10–300)
Microalb, Ur Waived: 30 mg/L — ABNORMAL HIGH (ref 0–19)

## 2019-03-18 LAB — BAYER DCA HB A1C WAIVED: HB A1C (BAYER DCA - WAIVED): 7.7 % — ABNORMAL HIGH (ref <7.0)

## 2019-03-18 NOTE — Patient Instructions (Signed)

## 2019-03-18 NOTE — Progress Notes (Signed)
BP 118/68 (BP Location: Left Arm, Patient Position: Sitting, Cuff Size: Normal)   Pulse 90   Temp 98.3 F (36.8 C) (Oral)   SpO2 97%    Subjective:    Patient ID: Edward Yang, male    DOB: 11-28-1950, 69 y.o.   MRN: [id number]  HPI: Edward Yang is a 69 y.o. male  Chief Complaint  Patient presents with  . Follow-up    3 months T2DM, HTN/HLD.   DIABETES Last A1C 7.2%.  Continues on Metformin 500 MG BID and Glipizide 5 MG daily.  Does endorse poor diet over holidays and he has been drinking coffee daily with lots of sugar. Hypoglycemic episodes:no Polydipsia/polyuria: no Visual disturbance: no Chest pain: no Paresthesias: no Glucose Monitoring: no             Accucheck frequency: Not Checking             Fasting glucose:             Post prandial:             Evening:             Before meals: Taking Insulin?: no             Long acting insulin:             Short acting insulin: Blood Pressure Monitoring: not checking Retinal Examination: Up to Date Foot Exam: Up to Date Pneumovax: Up to Date Influenza: Up to Date Aspirin: no   HYPERTENSION / HYPERLIPIDEMIA Continues on Pravastatin and Lisinopril. Satisfied with current treatment? yes Duration of hypertension: chronic BP monitoring frequency: not checking BP range:  BP medication side effects: no Duration of hyperlipidemia: chronic Cholesterol medication side effects: no Cholesterol supplements: none Medication compliance: good compliance Aspirin: no Recent stressors: no Recurrent headaches: no Visual changes: no Palpitations: no Dyspnea: no Chest pain: no Lower extremity edema: no Dizzy/lightheaded: no  THROMBOCYTOPENIA: Last PLT 142.  At baseline often 150-160 range.  Has been in 140's two years ago.  Denies bleeding episodes or easy bruising. Not currently on ASA or anticoag.  Relevant past medical, surgical, family and social history reviewed and updated as indicated. Interim medical history since  our last visit reviewed. Allergies and medications reviewed and updated.  Review of Systems  Constitutional: Negative for activity change, diaphoresis, fatigue and fever.  Respiratory: Negative for cough, chest tightness, shortness of breath and wheezing.   Cardiovascular: Negative for chest pain, palpitations and leg swelling.  Gastrointestinal: Negative.   Endocrine: Negative for cold intolerance, heat intolerance, polydipsia, polyphagia and polyuria.  Neurological: Negative.   Psychiatric/Behavioral: Negative.     Per HPI unless specifically indicated above     Objective:    BP 118/68 (BP Location: Left Arm, Patient Position: Sitting, Cuff Size: Normal)   Pulse 90   Temp 98.3 F (36.8 C) (Oral)   SpO2 97%   Wt Readings from Last 3 Encounters:  11/28/18 153 lb (69.4 kg)  07/09/18 150 lb (68 kg)  04/11/18 155 lb 11.2 oz (70.6 kg)    Physical Exam Vitals and nursing note reviewed.  Constitutional:      General: He is awake. He is not in acute distress.    Appearance: He is well-developed and overweight. He is not ill-appearing.  HENT:     Head: Normocephalic and atraumatic.     Right Ear: Hearing normal. No drainage.     Left Ear: Hearing normal. No drainage.  Eyes:  General: Lids are normal.        Right eye: No discharge.        Left eye: No discharge.     Conjunctiva/sclera: Conjunctivae normal.     Pupils: Pupils are equal, round, and reactive to light.  Neck:     Vascular: No carotid bruit.  Cardiovascular:     Rate and Rhythm: Normal rate and regular rhythm.     Heart sounds: Normal heart sounds, S1 normal and S2 normal. No murmur. No gallop.   Pulmonary:     Effort: Pulmonary effort is normal. No accessory muscle usage or respiratory distress.     Breath sounds: Normal breath sounds.  Abdominal:     General: Bowel sounds are normal.     Palpations: Abdomen is soft.  Musculoskeletal:        General: Normal range of motion.     Cervical back: Normal  range of motion and neck supple.     Right lower leg: No edema.     Left lower leg: No edema.  Skin:    General: Skin is warm and dry.     Capillary Refill: Capillary refill takes less than 2 seconds.  Neurological:     Mental Status: He is alert and oriented to person, place, and time.  Psychiatric:        Mood and Affect: Mood normal.        Behavior: Behavior normal. Behavior is cooperative.        Thought Content: Thought content normal.        Judgment: Judgment normal.    Diabetic Foot Exam - Simple   Simple Foot Form Visual Inspection No deformities, no ulcerations, no other skin breakdown bilaterally: Yes Sensation Testing Intact to touch and monofilament testing bilaterally: Yes Pulse Check Posterior Tibialis and Dorsalis pulse intact bilaterally: Yes Comments    Results for orders placed or performed in visit on 03/18/19  Microalbumin, Urine Waived here  Result Value Ref Range   Microalb, Ur Waived 30 (H) 0 - 19 mg/L   Creatinine, Urine Waived 50 10 - 300 mg/dL   Microalb/Creat Ratio 30-300 (H) <30 mg/g  Bayer DCA Hb A1c Waived  Result Value Ref Range   HB A1C (BAYER DCA - WAIVED) 7.7 (H) <7.0 %      Assessment & Plan:   Problem List Items Addressed This Visit      Cardiovascular and Mediastinum   Hypertension associated with diabetes ([place])    Chronic, ongoing with BP below goal today.  Continue current medication regimen and adjust as needed.  CMP today.  Recommend he check BP at home at least 3 mornings a week.      Relevant Orders   Bayer DCA Hb A1c Waived (Completed)     Endocrine   Hyperlipidemia associated with type 2 diabetes mellitus (HCC)    Chronic, ongoing.  Continue current medication regimen and adjust as needed.  Obtain lipid and CMP today.        Relevant Orders   Bayer DCA Hb A1c Waived (Completed)   Comprehensive metabolic panel   Lipid Panel w/o Chol/HDL Ratio out   Type 2 diabetes mellitus without complications (HCC) - Primary     Chronic, ongoing with A1C 7.7% today (increased from previous), urine micro ALB 30 and A:C 30-300.  Continue current medication regimen, he wishes to focus on diet and cut out all the sugar he has been taking in.  Recommend he check BS at home daily  at home, especially since on Glipizide, monitor for hypoglycemia.  Would consider addition of GLP or SGLT at next visit if ongoing elevation, could also continue max dose Metformin if no previous GI issues with this.  Return to office in 3 months.      Relevant Orders   Microalbumin, Urine Waived here (Completed)   Bayer DCA Hb A1c Waived (Completed)   Comprehensive metabolic panel     Other   Thrombocytopenia (HCC)    Recheck CBC at next visit and monitor for bleeding or increased bruising.  Refer to hematology if worsening.          Follow up plan: Return in about 3 months (around 06/16/2019) for T2DM, HTN/HLD.

## 2019-03-18 NOTE — Assessment & Plan Note (Addendum)
Chronic, ongoing with A1C 7.7% today (increased from previous), urine micro ALB 30 and A:C 30-300.  Continue current medication regimen, he wishes to focus on diet and cut out all the sugar he has been taking in.  Recommend he check BS at home daily at home, especially since on Glipizide, monitor for hypoglycemia.  Would consider addition of GLP or SGLT at next visit if ongoing elevation, could also continue max dose Metformin if no previous GI issues with this.  Return to office in 3 months.  CCM referral.

## 2019-03-18 NOTE — Assessment & Plan Note (Signed)
Chronic, ongoing with BP below goal today.  Continue current medication regimen and adjust as needed.  CMP today.  Recommend he check BP at home at least 3 mornings a week.

## 2019-03-18 NOTE — Assessment & Plan Note (Signed)
Recheck CBC at next visit and monitor for bleeding or increased bruising.  Refer to hematology if worsening.

## 2019-03-18 NOTE — Assessment & Plan Note (Signed)
Chronic, ongoing.  Continue current medication regimen and adjust as needed.  Obtain lipid and CMP today.

## 2019-03-19 LAB — LIPID PANEL W/O CHOL/HDL RATIO
Cholesterol, Total: 184 mg/dL (ref 100–199)
HDL: 64 mg/dL (ref >39)
LDL Chol Calc (NIH): 86 mg/dL (ref 0–99)
Triglycerides: 205 mg/dL — ABNORMAL HIGH (ref 0–149)
VLDL Cholesterol Cal: 34 mg/dL (ref 5–40)

## 2019-03-19 LAB — COMPREHENSIVE METABOLIC PANEL
ALT: 57 IU/L — ABNORMAL HIGH (ref 0–44)
AST: 35 IU/L (ref 0–40)
Albumin/Globulin Ratio: 1.7 (ref 1.2–2.2)
Albumin: 4.7 g/dL (ref 3.8–4.8)
Alkaline Phosphatase: 107 IU/L (ref 39–117)
BUN/Creatinine Ratio: 10 (ref 10–24)
BUN: 11 mg/dL (ref 8–27)
Bilirubin Total: 0.7 mg/dL (ref 0.0–1.2)
CO2: 26 mmol/L (ref 20–29)
Calcium: 10.1 mg/dL (ref 8.6–10.2)
Chloride: 100 mmol/L (ref 96–106)
Creatinine, Ser: 1.09 mg/dL (ref 0.76–1.27)
GFR calc Af Amer: 79 mL/min/{1.73_m2} (ref >59)
GFR calc non Af Amer: 68 mL/min/{1.73_m2} (ref >59)
Globulin, Total: 2.7 g/dL (ref 1.5–4.5)
Glucose: 118 mg/dL — ABNORMAL HIGH (ref 65–99)
Potassium: 4.4 mmol/L (ref 3.5–5.2)
Sodium: 140 mmol/L (ref 134–144)
Total Protein: 7.4 g/dL (ref 6.0–8.5)

## 2019-03-19 NOTE — Addendum Note (Signed)
Addended by: Aura Dials T on: 03/19/2019 08:18 PM   Modules accepted: Orders

## 2019-03-19 NOTE — Progress Notes (Signed)
Please let patient know kidney function remains stable.  Liver function showed mild increase in one lab, will continue to monitor and work on healthy diabetic diet and some weight loss at home.  Cholesterol levels good.  See you next visit.

## 2019-03-22 ENCOUNTER — Telehealth: Payer: Self-pay | Admitting: Family Medicine

## 2019-03-22 NOTE — Chronic Care Management (AMB) (Signed)
  Chronic Care Management   Note  03/22/2019 Name: Edward Yang MRN: [id number] DOB: Jul 23, 1950  Edward Yang is a 69 y.o. year old male who is a primary care patient of Edward Yang, Edward How, Edward Yang. I reached out to Edward Yang by phone today in response to a referral sent by Edward Yang, Edward Yang     Edward Yang was given information about Chronic Care Management services today including:  1. CCM service includes personalized support from designated clinical staff supervised by his physician, including individualized plan of care and coordination with other care providers 2. 24/7 contact phone numbers for assistance for urgent and routine care needs. 3. Service will only be billed when office clinical staff spend 20 minutes or more in a month to coordinate care. 4. Only one practitioner may furnish and bill the service in a calendar month. 5. The patient may stop CCM services at any time (effective at the end of the month) by phone call to the office staff. 6. The patient will be responsible for cost sharing (co-pay) of up to 20% of the service fee (after annual deductible is met).  Patient did not agree to enrollment in care management services and does not wish to consider at this time.  Follow up plan: The patient has been provided with contact information for the care management team and has been advised to call with any health related questions or concerns.   Glenna Durand, LPN Health Advisor, Embedded Care Coordination [facility] ??[email or web address] ??[phone]

## 2019-04-15 ENCOUNTER — Ambulatory Visit: Payer: Medicare HMO

## 2019-05-04 DIAGNOSIS — Z23 Encounter for immunization: Secondary | ICD-10-CM | POA: Diagnosis not present

## 2019-06-01 DIAGNOSIS — Z23 Encounter for immunization: Secondary | ICD-10-CM | POA: Diagnosis not present

## 2019-06-17 ENCOUNTER — Encounter: Payer: Self-pay | Admitting: Nurse Practitioner

## 2019-06-17 ENCOUNTER — Ambulatory Visit (INDEPENDENT_AMBULATORY_CARE_PROVIDER_SITE_OTHER): Payer: Medicare HMO | Admitting: Nurse Practitioner

## 2019-06-17 ENCOUNTER — Other Ambulatory Visit: Payer: Self-pay

## 2019-06-17 ENCOUNTER — Ambulatory Visit (INDEPENDENT_AMBULATORY_CARE_PROVIDER_SITE_OTHER): Payer: Medicare HMO

## 2019-06-17 VITALS — BP 122/69 | HR 84 | Temp 97.9°F | Ht 62.0 in | Wt 151.3 lb

## 2019-06-17 DIAGNOSIS — Z0000 Encounter for general adult medical examination without abnormal findings: Secondary | ICD-10-CM

## 2019-06-17 DIAGNOSIS — R809 Proteinuria, unspecified: Secondary | ICD-10-CM

## 2019-06-17 DIAGNOSIS — E1159 Type 2 diabetes mellitus with other circulatory complications: Secondary | ICD-10-CM

## 2019-06-17 DIAGNOSIS — Z1211 Encounter for screening for malignant neoplasm of colon: Secondary | ICD-10-CM

## 2019-06-17 DIAGNOSIS — I10 Essential (primary) hypertension: Secondary | ICD-10-CM | POA: Diagnosis not present

## 2019-06-17 DIAGNOSIS — I152 Hypertension secondary to endocrine disorders: Secondary | ICD-10-CM

## 2019-06-17 DIAGNOSIS — E1129 Type 2 diabetes mellitus with other diabetic kidney complication: Secondary | ICD-10-CM

## 2019-06-17 DIAGNOSIS — E1169 Type 2 diabetes mellitus with other specified complication: Secondary | ICD-10-CM

## 2019-06-17 DIAGNOSIS — E785 Hyperlipidemia, unspecified: Secondary | ICD-10-CM | POA: Diagnosis not present

## 2019-06-17 NOTE — Progress Notes (Signed)
BP 122/69   Pulse 84   Temp 97.9 F (36.6 C) (Oral)   Ht 5\' 2"  (1.575 m)   Wt 151 lb 4.8 oz (68.6 kg)   SpO2 96%   BMI 27.67 kg/m    Subjective:    Patient ID: Edward Yang, male    DOB: 09-13-1950, 69 y.o.   MRN: [id number]  HPI: Edward Yang is a 69 y.o. male  Chief Complaint  Patient presents with  . Diabetes  . Hyperlipidemia  . Hypertension    DIABETES Last A1C 7.7%. Continues on Metformin 500 MG BID and Glipizide 5 MG daily. Does endorse poor diet over holidays and he had been drinking coffee daily with lots of sugar, has been working on diet changes since last visit, but still enjoying sweets. Hypoglycemic episodes:no Polydipsia/polyuria:no Visual disturbance:no Chest pain:no Paresthesias:no Glucose Monitoring:no Accucheck frequency: rarely Fasting glucose: 170-180 at times Post prandial: 200 at times Evening: Before meals: Taking Insulin?:no Long acting insulin: Short acting insulin: Blood Pressure Monitoring:not checking Retinal Examination:Up to Date Foot Exam:Up to Date Pneumovax:Up to Date Influenza:Up to Date Aspirin:no  HYPERTENSION / HYPERLIPIDEMIA Continues on Pravastatin and Lisinopril. Satisfied with current treatment?yes Duration of hypertension:chronic BP monitoring frequency:not checking BP range:  BP medication side effects:no Duration of hyperlipidemia:chronic Cholesterol medication side effects:no Cholesterol supplements: none Medication compliance:good compliance Aspirin:no Recent stressors:no Recurrent headaches:no Visual changes:no Palpitations:no Dyspnea:no Chest pain:no Lower extremity edema:no Dizzy/lightheaded:no  Relevant past medical, surgical, family and social history reviewed and updated as indicated. Interim medical history since our last visit reviewed. Allergies and medications reviewed and  updated.  Review of Systems  Constitutional: Negative for activity change, diaphoresis, fatigue and fever.  Respiratory: Negative for cough, chest tightness, shortness of breath and wheezing.   Cardiovascular: Negative for chest pain, palpitations and leg swelling.  Gastrointestinal: Negative.   Endocrine: Negative for cold intolerance, heat intolerance, polydipsia, polyphagia and polyuria.  Neurological: Negative.   Psychiatric/Behavioral: Negative.     Per HPI unless specifically indicated above     Objective:    BP 122/69   Pulse 84   Temp 97.9 F (36.6 C) (Oral)   Ht 5\' 2"  (1.575 m)   Wt 151 lb 4.8 oz (68.6 kg)   SpO2 96%   BMI 27.67 kg/m   Wt Readings from Last 3 Encounters:  06/17/19 151 lb 4.8 oz (68.6 kg)  06/17/19 151 lb 4.8 oz (68.6 kg)  11/28/18 153 lb (69.4 kg)    Physical Exam Vitals and nursing note reviewed.  Constitutional:      General: He is awake. He is not in acute distress.    Appearance: He is well-developed and well-groomed. He is not ill-appearing.  HENT:     Head: Normocephalic and atraumatic.     Right Ear: Hearing normal. No drainage.     Left Ear: Hearing normal. No drainage.  Eyes:     General: Lids are normal.        Right eye: No discharge.        Left eye: No discharge.     Conjunctiva/sclera: Conjunctivae normal.     Pupils: Pupils are equal, round, and reactive to light.  Neck:     Vascular: No carotid bruit.  Cardiovascular:     Rate and Rhythm: Normal rate and regular rhythm.     Heart sounds: Normal heart sounds, S1 normal and S2 normal. No murmur. No gallop.   Pulmonary:     Effort: Pulmonary effort is normal. No accessory  muscle usage or respiratory distress.     Breath sounds: Normal breath sounds.  Abdominal:     General: Bowel sounds are normal.     Palpations: Abdomen is soft.  Musculoskeletal:        General: Normal range of motion.     Cervical back: Normal range of motion and neck supple.     Right lower leg: No  edema.     Left lower leg: No edema.  Skin:    General: Skin is warm and dry.     Capillary Refill: Capillary refill takes less than 2 seconds.  Neurological:     Mental Status: He is alert and oriented to person, place, and time.  Psychiatric:        Attention and Perception: Attention normal.        Mood and Affect: Mood normal.        Speech: Speech normal.        Behavior: Behavior normal. Behavior is cooperative.        Thought Content: Thought content normal.     Results for orders placed or performed in visit on 03/18/19  Microalbumin, Urine Waived here  Result Value Ref Range   Microalb, Ur Waived 30 (H) 0 - 19 mg/L   Creatinine, Urine Waived 50 10 - 300 mg/dL   Microalb/Creat Ratio 30-300 (H) <30 mg/g  Bayer DCA Hb A1c Waived  Result Value Ref Range   HB A1C (BAYER DCA - WAIVED) 7.7 (H) <7.0 %  Comprehensive metabolic panel  Result Value Ref Range   Glucose 118 (H) 65 - 99 mg/dL   BUN 11 8 - 27 mg/dL   Creatinine, Ser 6.23 0.76 - 1.27 mg/dL   GFR calc non Af Amer 68 >59 mL/min/1.73   GFR calc Af Amer 79 >59 mL/min/1.73   BUN/Creatinine Ratio 10 10 - 24   Sodium 140 134 - 144 mmol/L   Potassium 4.4 3.5 - 5.2 mmol/L   Chloride 100 96 - 106 mmol/L   CO2 26 20 - 29 mmol/L   Calcium 10.1 8.6 - 10.2 mg/dL   Total Protein 7.4 6.0 - 8.5 g/dL   Albumin 4.7 3.8 - 4.8 g/dL   Globulin, Total 2.7 1.5 - 4.5 g/dL   Albumin/Globulin Ratio 1.7 1.2 - 2.2   Bilirubin Total 0.7 0.0 - 1.2 mg/dL   Alkaline Phosphatase 107 39 - 117 IU/L   AST 35 0 - 40 IU/L   ALT 57 (H) 0 - 44 IU/L  Lipid Panel w/o Chol/HDL Ratio out  Result Value Ref Range   Cholesterol, Total 184 100 - 199 mg/dL   Triglycerides 762 (H) 0 - 149 mg/dL   HDL 64 >83 mg/dL   VLDL Cholesterol Cal 34 5 - 40 mg/dL   LDL Chol Calc (NIH) 86 0 - 99 mg/dL      Assessment & Plan:   Problem List Items Addressed This Visit      Cardiovascular and Mediastinum   Hypertension associated with diabetes (HCC)    Chronic,  ongoing with BP below goal today.  Continue current medication regimen and adjust as needed.  BMP today.  Recommend he check BP at home at least 3 mornings a week.  Continue Lisinopril for kidney protection.      Relevant Orders   Bayer DCA Hb A1c Waived   Basic metabolic panel     Endocrine   Hyperlipidemia associated with type 2 diabetes mellitus (HCC)    Chronic, ongoing.  Continue  current medication regimen and adjust as needed.  Obtain lipid panel next visit, recent LDL 86, may benefit from change to Atorvastatin or Crestor next visit for tighter control if LDL >70.        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  Continue current medication regimen, he wishes to continue to focus on diet changes.  Recommend he check BS at home daily at home, especially since on Glipizide, monitor for hypoglycemia.  Would consider addition of GLP or SGLT at next visit if ongoing elevation, could also continue max dose Metformin if no previous GI issues with this.  Continue Lisinopril for kidney protection. Return to office in 3 months.        Relevant Orders   Bayer DCA Hb A1c Waived   Basic metabolic panel       Follow up plan: Return in about 3 months (around 09/16/2019) for T2DM, HTN/HLD.

## 2019-06-17 NOTE — Assessment & Plan Note (Signed)
Chronic, ongoing.  Continue current medication regimen and adjust as needed.  Obtain lipid panel next visit, recent LDL 86, may benefit from change to Atorvastatin or Crestor next visit for tighter control if LDL >70.   

## 2019-06-17 NOTE — Patient Instructions (Signed)
Carbohydrate Counting for Diabetes Mellitus, Adult  Carbohydrate counting is a method of keeping track of how many carbohydrates you eat. Eating carbohydrates naturally increases the amount of sugar (glucose) in the blood. Counting how many carbohydrates you eat helps keep your blood glucose within normal limits, which helps you manage your diabetes (diabetes mellitus). It is important to know how many carbohydrates you can safely have in each meal. This is different for every person. A diet and nutrition specialist (registered dietitian) can help you make a meal plan and calculate how many carbohydrates you should have at each meal and snack. Carbohydrates are found in the following foods:  Grains, such as breads and cereals.  Dried beans and soy products.  Starchy vegetables, such as potatoes, peas, and corn.  Fruit and fruit juices.  Milk and yogurt.  Sweets and snack foods, such as cake, cookies, candy, chips, and soft drinks. How do I count carbohydrates? There are two ways to count carbohydrates in food. You can use either of the methods or a combination of both. Reading "Nutrition Facts" on packaged food The "Nutrition Facts" list is included on the labels of almost all packaged foods and beverages in the [place] It includes:  The serving size.  Information about nutrients in each serving, including the grams (g) of carbohydrate per serving. To use the "Nutrition Facts":  Decide how many servings you will have.  Multiply the number of servings by the number of carbohydrates per serving.  The resulting number is the total amount of carbohydrates that you will be having. Learning standard serving sizes of other foods When you eat carbohydrate foods that are not packaged or do not include "Nutrition Facts" on the label, you need to measure the servings in order to count the amount of carbohydrates:  Measure the foods that you will eat with a food scale or measuring cup, if  needed.  Decide how many standard-size servings you will eat.  Multiply the number of servings by 15. Most carbohydrate-rich foods have about 15 g of carbohydrates per serving. ? For example, if you eat 8 oz (170 g) of strawberries, you will have eaten 2 servings and 30 g of carbohydrates (2 servings x 15 g = 30 g).  For foods that have more than one food mixed, such as soups and casseroles, you must count the carbohydrates in each food that is included. The following list contains standard serving sizes of common carbohydrate-rich foods. Each of these servings has about 15 g of carbohydrates:   hamburger bun or  English muffin.   oz (15 mL) syrup.   oz (14 g) jelly.  1 slice of bread.  1 six-inch tortilla.  3 oz (85 g) cooked rice or pasta.  4 oz (113 g) cooked dried beans.  4 oz (113 g) starchy vegetable, such as peas, corn, or potatoes.  4 oz (113 g) hot cereal.  4 oz (113 g) mashed potatoes or  of a large baked potato.  4 oz (113 g) canned or frozen fruit.  4 oz (120 mL) fruit juice.  4-6 crackers.  6 chicken nuggets.  6 oz (170 g) unsweetened dry cereal.  6 oz (170 g) plain fat-free yogurt or yogurt sweetened with artificial sweeteners.  8 oz (240 mL) milk.  8 oz (170 g) fresh fruit or one small piece of fruit.  24 oz (680 g) popped popcorn. Example of carbohydrate counting Sample meal  3 oz (85 g) chicken breast.  6 oz (170 g)   brown rice.  4 oz (113 g) corn.  8 oz (240 mL) milk.  8 oz (170 g) strawberries with sugar-free whipped topping. Carbohydrate calculation 1. Identify the foods that contain carbohydrates: ? Rice. ? Corn. ? Milk. ? Strawberries. 2. Calculate how many servings you have of each food: ? 2 servings rice. ? 1 serving corn. ? 1 serving milk. ? 1 serving strawberries. 3. Multiply each number of servings by 15 g: ? 2 servings rice x 15 g = 30 g. ? 1 serving corn x 15 g = 15 g. ? 1 serving milk x 15 g = 15 g. ? 1  serving strawberries x 15 g = 15 g. 4. Add together all of the amounts to find the total grams of carbohydrates eaten: ? 30 g + 15 g + 15 g + 15 g = 75 g of carbohydrates total. Summary  Carbohydrate counting is a method of keeping track of how many carbohydrates you eat.  Eating carbohydrates naturally increases the amount of sugar (glucose) in the blood.  Counting how many carbohydrates you eat helps keep your blood glucose within normal limits, which helps you manage your diabetes.  A diet and nutrition specialist (registered dietitian) can help you make a meal plan and calculate how many carbohydrates you should have at each meal and snack. This information is not intended to replace advice given to you by your health care provider. Make sure you discuss any questions you have with your health care provider. Document Revised: 09/15/2016 Document Reviewed: 08/05/2015 Elsevier Patient Education  2020 [employer]  

## 2019-06-17 NOTE — Assessment & Plan Note (Signed)
Chronic, ongoing with A1C trending downwards this visit at 7.2%.  Continue current medication regimen, he wishes to continue to focus on diet changes.  Recommend he check BS at home daily at home, especially since on Glipizide, monitor for hypoglycemia.  Would consider addition of GLP or SGLT at next visit if ongoing elevation, could also continue max dose Metformin if no previous GI issues with this.  Continue Lisinopril for kidney protection. Return to office in 3 months.

## 2019-06-17 NOTE — Assessment & Plan Note (Signed)
Chronic, ongoing with BP below goal today.  Continue current medication regimen and adjust as needed.  BMP today.  Recommend he check BP at home at least 3 mornings a week.  Continue Lisinopril for kidney protection.

## 2019-06-17 NOTE — Patient Instructions (Signed)
Mr. Norfleet , Thank you for taking time to come for your Medicare Wellness Visit. I appreciate your ongoing commitment to your health goals. Please review the following plan we discussed and let me know if I can assist you in the future.   Screening recommendations/referrals: Colonoscopy: cologuard completed 06/27/2016, due now. Ordered.  Recommended yearly ophthalmology/optometry visit for glaucoma screening and checkup Recommended yearly dental visit for hygiene and checkup  Vaccinations: Influenza vaccine: up to date  Pneumococcal vaccine: up to date  Tdap vaccine: up to date  Shingles vaccine: shingrix completed   Covid-19: completed   Advanced directives: completed   Conditions/risks identified: none   Next appointment: Follow up in one year for your annual wellness visit   Preventive Care 65 Years and Older, Male Preventive care refers to lifestyle choices and visits with your health care provider that can promote health and wellness. What does preventive care include?  A yearly physical exam. This is also called an annual well check.  Dental exams once or twice a year.  Routine eye exams. Ask your health care provider how often you should have your eyes checked.  Personal lifestyle choices, including:  Daily care of your teeth and gums.  Regular physical activity.  Eating a healthy diet.  Avoiding tobacco and drug use.  Limiting alcohol use.  Practicing safe sex.  Taking low doses of aspirin every day.  Taking vitamin and mineral supplements as recommended by your health care provider. What happens during an annual well check? The services and screenings done by your health care provider during your annual well check will depend on your age, overall health, lifestyle risk factors, and family history of disease. Counseling  Your health care provider may ask you questions about your:  Alcohol use.  Tobacco use.  Drug use.  Emotional well-being.  Home and  relationship well-being.  Sexual activity.  Eating habits.  History of falls.  Memory and ability to understand (cognition).  Work and work Astronomer. Screening  You may have the following tests or measurements:  Height, weight, and BMI.  Blood pressure.  Lipid and cholesterol levels. These may be checked every 5 years, or more frequently if you are over 46 years old.  Skin check.  Lung cancer screening. You may have this screening every year starting at age 55 if you have a 30-pack-year history of smoking and currently smoke or have quit within the past 15 years.  Fecal occult blood test (FOBT) of the stool. You may have this test every year starting at age 73.  Flexible sigmoidoscopy or colonoscopy. You may have a sigmoidoscopy every 5 years or a colonoscopy every 10 years starting at age 73.  Prostate cancer screening. Recommendations will vary depending on your family history and other risks.  Hepatitis C blood test.  Hepatitis B blood test.  Sexually transmitted disease (STD) testing.  Diabetes screening. This is done by checking your blood sugar (glucose) after you have not eaten for a while (fasting). You may have this done every 1-3 years.  Abdominal aortic aneurysm (AAA) screening. You may need this if you are a current or former smoker.  Osteoporosis. You may be screened starting at age 73 if you are at high risk. Talk with your health care provider about your test results, treatment options, and if necessary, the need for more tests. Vaccines  Your health care provider may recommend certain vaccines, such as:  Influenza vaccine. This is recommended every year.  Tetanus, diphtheria, and acellular  pertussis (Tdap, Td) vaccine. You may need a Td booster every 10 years.  Zoster vaccine. You may need this after age 18.  Pneumococcal 13-valent conjugate (PCV13) vaccine. One dose is recommended after age 31.  Pneumococcal polysaccharide (PPSV23) vaccine. One  dose is recommended after age 58. Talk to your health care provider about which screenings and vaccines you need and how often you need them. This information is not intended to replace advice given to you by your health care provider. Make sure you discuss any questions you have with your health care provider. Document Released: 03/20/2015 Document Revised: 11/11/2015 Document Reviewed: 12/23/2014 Elsevier Interactive Patient Education  2017 [place] Prevention in the Home Falls can cause injuries. They can happen to people of all ages. There are many things you can do to make your home safe and to help prevent falls. What can I do on the outside of my home?  Regularly fix the edges of walkways and driveways and fix any cracks.  Remove anything that might make you trip as you walk through a door, such as a raised step or threshold.  Trim any bushes or trees on the path to your home.  Use bright outdoor lighting.  Clear any walking paths of anything that might make someone trip, such as rocks or tools.  Regularly check to see if handrails are loose or broken. Make sure that both sides of any steps have handrails.  Any raised decks and porches should have guardrails on the edges.  Have any leaves, snow, or ice cleared regularly.  Use sand or salt on walking paths during winter.  Clean up any spills in your garage right away. This includes oil or grease spills. What can I do in the bathroom?  Use night lights.  Install grab bars by the toilet and in the tub and shower. Do not use towel bars as grab bars.  Use non-skid mats or decals in the tub or shower.  If you need to sit down in the shower, use a plastic, non-slip stool.  Keep the floor dry. Clean up any water that spills on the floor as soon as it happens.  Remove soap buildup in the tub or shower regularly.  Attach bath mats securely with double-sided non-slip rug tape.  Do not have throw rugs and other  things on the floor that can make you trip. What can I do in the bedroom?  Use night lights.  Make sure that you have a light by your bed that is easy to reach.  Do not use any sheets or blankets that are too big for your bed. They should not hang down onto the floor.  Have a firm chair that has side arms. You can use this for support while you get dressed.  Do not have throw rugs and other things on the floor that can make you trip. What can I do in the kitchen?  Clean up any spills right away.  Avoid walking on wet floors.  Keep items that you use a lot in easy-to-reach places.  If you need to reach something above you, use a strong step stool that has a grab bar.  Keep electrical cords out of the way.  Do not use floor polish or wax that makes floors slippery. If you must use wax, use non-skid floor wax.  Do not have throw rugs and other things on the floor that can make you trip. What can I do with my stairs?  Do not leave any items on the stairs.  Make sure that there are handrails on both sides of the stairs and use them. Fix handrails that are broken or loose. Make sure that handrails are as long as the stairways.  Check any carpeting to make sure that it is firmly attached to the stairs. Fix any carpet that is loose or worn.  Avoid having throw rugs at the top or bottom of the stairs. If you do have throw rugs, attach them to the floor with carpet tape.  Make sure that you have a light switch at the top of the stairs and the bottom of the stairs. If you do not have them, ask someone to add them for you. What else can I do to help prevent falls?  Wear shoes that:  Do not have high heels.  Have rubber bottoms.  Are comfortable and fit you well.  Are closed at the toe. Do not wear sandals.  If you use a stepladder:  Make sure that it is fully opened. Do not climb a closed stepladder.  Make sure that both sides of the stepladder are locked into place.  Ask  someone to hold it for you, if possible.  Clearly mark and make sure that you can see:  Any grab bars or handrails.  First and last steps.  Where the edge of each step is.  Use tools that help you move around (mobility aids) if they are needed. These include:  Canes.  Walkers.  Scooters.  Crutches.  Turn on the lights when you go into a dark area. Replace any light bulbs as soon as they burn out.  Set up your furniture so you have a clear path. Avoid moving your furniture around.  If any of your floors are uneven, fix them.  If there are any pets around you, be aware of where they are.  Review your medicines with your doctor. Some medicines can make you feel dizzy. This can increase your chance of falling. Ask your doctor what other things that you can do to help prevent falls. This information is not intended to replace advice given to you by your health care provider. Make sure you discuss any questions you have with your health care provider. Document Released: 12/18/2008 Document Revised: 07/30/2015 Document Reviewed: 03/28/2014 Elsevier Interactive Patient Education  2017 [place].  Mr. Vallance, ?? ?? ?? ??? ?? ??? ? ??? ?????. ??? ?? ??? ?? ??? ???? ??? ??????. ??? ?? ??? ???? ??? ??? ???? ??????.  ?? ?? / ?? : ?? ??? ?? : cologuard? 2018 ? 4 ? 23 ?? ???????. ??????. ??? ?? ? ??? ?? ?? ???? ?? / ?? ?? ?? ? ????? ?? ?? ?? ??  ?? ?? : ????? ?? : ?? ??? ?? : ?? Tdap ?? : ?? ?? ?? ?? : Shingrix ?? Covid-19 : ??  ?? ??? : ??  ?? ? ?? / ?? : ??  ?? ?? : ?? ?? ????? ?? ?? Mr. princeston, blizzard welbing bangmun-eul wihae sigan-eul nae jusyeoseo gamsahabnida. gwihaui geongang mogpyoe daehan gwihaui jisogjeog-in nolyeog-e gamsadeulibnida. non-uihan da-eum gyehoeg-eul geomtohago najung-e doum-i pil-yohamyeon allyeojusibsio.  seonbyeol chucheon / chucheon : daejang naesigyeong geomsa : cologuardga 2018 nyeon 4 wol 23 il-e wanlyodoeeossseubnida. jumunhaessseubnida.  nognaejang geomjin mich geomjin-eul wihae maenyeon gwonjangdoeneun angwa / geom-an bangmun wisaeng mich geomjin-eul-wihan gwonjang yeongan chigwa bangmun  yebang jeobjong : inpeulluenja baegsin : choesin pyegugyun baegsin : choesin Tdap baegsin : choesin daesang pojin  baegsin : Shingrix wanseong Covid-19 : wanlyo  gogeub jisimun : wanlyo  hwag-in doen jogeon / wiheom : eobs-eum  da-eum yagsog : yeongan welbing bangmun-eul-wihan husog jochi  ?? ?? 63 ? ??, ?? ?? ? ??? ??? ??? ?? ? ??? ?? ?? ?? ? ?? ????? ??? ?????. ?? ???? ??? ?????? ?? ?? ??. ?? ?? ?? ?????????. ??? ?? ? ?? ??. ?? ?? ??. ??? ?? ? ??? ?????? ?? ??? ????? ??????. ??? ??? ?? ?? ?? ?? : ??? ??? ?? ?????. ???? ?? ??. ??? ?? ??. ?? ? ?? ??? ?????. ??? ?? ??. ??? ???? ?????. ?? ???? ???? ??. ?? ?? ???? ???? ??? ? ??? ???? ??????. ?? ?? ?? ?? ?? ?? ?????? ?? ?? ?? ? ?? ??? ???? ???? ??? ? ??? ??, ???? ?? ??, ?? ?? ?? ?? ? ??? ???? ?? ?????. yebang chilyo 63 se isang, namseong yebang jeog chilyoneun geongang-gwa welbing-eul jeungjin hal su-issneun saenghwal bangsig seontaeg mich uilyo jegongjawaui bangmun-eul uimihabnida. yebang chilyoeneun mueos-i pohamdoebnikka? maenyeon sinche geomsa. ileul yeongan yujeong jeomgeom-ilagodohabnida. ilnyeon-e handu beon chigwa geomsa. jeong-gi silyeog geomsa. eolmana jaju nun geomsaleul bad-ayahaneunji uilyo seobiseu jegongja-ege mun-uihasibsio. da-eum-eul pohamhan gaein saenghwal bangsig seontaeg : chiawa ismom-eul maeil gwanlihabnida. gyuchigjeog-in sinche hwaldong. geonganghan sigdan seobchwi. dambae mich yagmul sayong-eul pihasibsio. alkool [employer]. anjeonhan seong-gwangyeleul silcheonhabnida. maeil jeoyonglyang-ui aseupilin bog-yong. geongang gwanli gong-geubjaga gwonjanghaneun bitamin mich minelal bochungjeleul bog-yonghasibsio. yeongan yujeong jeomgeom jung-e eotteon il-i balsaenghabnikka? yeongan geongang geomjin jung uilyo seobiseu jegongjaga silsihaneun  seobiseu mich geomsaneun yeonlyeong, jeonbanjeog-in geongang sangtae, saenghwal seubgwan wiheom yoin mich jilbyeong-ui gajoglyeog-e ttala dallajibnida.

## 2019-06-17 NOTE — Progress Notes (Signed)
Subjective:   Edward Yang is a 69 y.o. male who presents for Medicare Annual/Subsequent preventive examination.  Review of Systems:   Cardiac Risk Factors include: advanced age (>17men, >63 women);hypertension;male gender;dyslipidemia     Objective:    Vitals: BP 122/69 (BP Location: Left Arm, Patient Position: Sitting, Cuff Size: Normal)   Pulse 84   Temp 97.9 F (36.6 C) (Temporal)   Ht 5\' 2"  (1.575 m)   Wt 151 lb 4.8 oz (68.6 kg)   SpO2 96%   BMI 27.67 kg/m   Body mass index is 27.67 kg/m.  Advanced Directives 06/17/2019 07/09/2018 04/11/2018  Does Patient Have a Medical Advance Directive? Yes No No  Type of Advance Directive Living will - -  Would patient like information on creating a medical advance directive? - - Yes (MAU/Ambulatory/Procedural Areas - Information given)    Tobacco Social History   Tobacco Use  Smoking Status Former Smoker  . Quit date: 11/03/1994  . Years since quitting: 24.6  Smokeless Tobacco Never Used     Counseling given: Not Answered   Clinical Intake:                 Interpreter Needed?: Yes Interpreter Agency: video interptor line Interpreter Name: 11/05/1994 ID: Research scientist (medical) Patient Declined Interpreter : No Patient signed [place] waiver: Yes  Information entered by :: Yasmeen Manka,LPN  Past Medical History:  Diagnosis Date  . Diabetes mellitus without complication (HCC)   . Hyperlipidemia   . Hypertension    History reviewed. No pertinent surgical history. Family History  Problem Relation Age of Onset  . Healthy Mother   . Healthy Father    Social History   Socioeconomic History  . Marital status: Married    Spouse name: Not on file  . Number of children: Not on file  . Years of education: Not on file  . Highest education level: Some college, no degree  Occupational History  . Occupation: convience store     Comment: full time   Tobacco Use  . Smoking status: Former Smoker    Quit date:  11/03/1994    Years since quitting: 24.6  . Smokeless tobacco: Never Used  Substance and Sexual Activity  . Alcohol use: No    Alcohol/week: 0.0 standard drinks  . Drug use: No  . Sexual activity: Not on file  Other Topics Concern  . Not on file  Social History Narrative  . Not on file   Social Determinants of Health   Financial Resource Strain:   . Difficulty of Paying Living Expenses:   Food Insecurity:   . Worried About 11/05/1994 in the Last Year:   . Programme researcher, broadcasting/film/video in the Last Year:   Transportation Needs:   . Barista (Medical):   Freight forwarder Lack of Transportation (Non-Medical):   Physical Activity:   . Days of Exercise per Week:   . Minutes of Exercise per Session:   Stress:   . Feeling of Stress :   Social Connections:   . Frequency of Communication with Friends and Family:   . Frequency of Social Gatherings with Friends and Family:   . Attends Religious Services:   . Active Member of Clubs or Organizations:   . Attends [employer] Meetings:   Banker Marital Status:     Outpatient Encounter Medications as of 06/17/2019  Medication Sig  . glipiZIDE (GLUCOTROL) 5 MG tablet Take 1 tablet (5 mg total) by mouth  daily before breakfast.  . lisinopril (ZESTRIL) 10 MG tablet Take 1 tablet (10 mg total) by mouth daily.  . metFORMIN (GLUCOPHAGE) 500 MG tablet Take 1 tablet (500 mg total) by mouth 2 (two) times daily with a meal.  . pravastatin (PRAVACHOL) 40 MG tablet Take 1 tablet (40 mg total) by mouth daily.  . sildenafil (REVATIO) 20 MG tablet Take 1-5 tablets (20-100 mg total) by mouth as needed.   No facility-administered encounter medications on file as of 06/17/2019.    Activities of Daily Living In your present state of health, do you have any difficulty performing the following activities: 06/17/2019 11/28/2018  Hearing? N N  Comment no hearing aids -  Vision? N N  Comment no glasses, [facility] -  Difficulty concentrating or making  decisions? N N  Walking or climbing stairs? N N  Dressing or bathing? N N  Doing errands, shopping? N N  Preparing Food and eating ? N -  Using the Toilet? N -  In the past six months, have you accidently leaked urine? N -  Do you have problems with loss of bowel control? N -  Managing your Medications? N -  Managing your Finances? N -  Housekeeping or managing your Housekeeping? N -  Some recent data might be hidden    Patient Care Team: Guadalupe Maple, MD as PCP - General (Family Medicine)   Assessment:   This is a routine wellness examination for Edward Yang.  Exercise Activities and Dietary recommendations Current Exercise Habits: Home exercise routine, Type of exercise: walking, Time (Minutes): 30, Frequency (Times/Week): 3, Weekly Exercise (Minutes/Week): 90, Intensity: Mild, Exercise limited by: None identified  Goals Addressed   None     Fall Risk: Fall Risk  06/17/2019 04/11/2018 01/01/2018 09/25/2017 07/12/2017  Falls in the past year? 0 0 No No No  Number falls in past yr: 0 0 - - -  Injury with Fall? 0 - - - -    FALL RISK PREVENTION PERTAINING TO THE HOME:  Any stairs in or around the home? Yes  If so, are there any without handrails? No   Home free of loose throw rugs in walkways, pet beds, electrical cords, etc? Yes  Adequate lighting in your home to reduce risk of falls? Yes   ASSISTIVE DEVICES UTILIZED TO PREVENT FALLS:  Life alert? No  Use of a cane, walker or w/c? No  Grab bars in the bathroom? Yes  Shower chair or bench in shower? No  Elevated toilet seat or a handicapped toilet? No   TIMED UP AND GO:  Was the test performed? Yes .  Length of time to ambulate 10 feet: 8 sec.   GAIT:  Appearance of gait: Gait steady and fast without the use of an assistive device.  Education: Fall risk prevention has been discussed.  Intervention(s) required? No  DME/home health order needed?  No   Depression Screen PHQ 2/9 Scores 06/17/2019 11/28/2018 04/11/2018  09/25/2017  PHQ - 2 Score 0 0 0 0  PHQ- 9 Score - 0 - -    Cognitive Function        Immunization History  Administered Date(s) Administered  . Influenza, High Dose Seasonal PF 11/16/2015, 12/21/2016, 11/12/2017, 11/14/2018  . Influenza,inj,Quad PF,6+ Mos 12/11/2014  . Influenza-Unspecified 12/05/2013, 11/12/2017  . Moderna SARS-COVID-2 Vaccination 05/04/2019, 06/01/2019  . Pneumococcal Conjugate-13 08/12/2015  . Pneumococcal Polysaccharide-23 09/25/2017  . Pneumococcal-Unspecified 08/02/2012  . Tdap 11/16/2015  . Zoster Recombinat (Shingrix) 08/11/2017, 11/12/2017  Qualifies for Shingles Vaccine?yes, completed   Tdap: up to date  Flu Vaccine: up to date   Pneumococcal Vaccine: up to date   Covid-19 Vaccine: Completed vaccines  Screening Tests Health Maintenance  Topic Date Due  . Fecal DNA (Cologuard)  06/28/2019  . HEMOGLOBIN A1C  09/15/2019  . INFLUENZA VACCINE  10/06/2019  . OPHTHALMOLOGY EXAM  11/18/2019  . FOOT EXAM  11/28/2019  . TETANUS/TDAP  11/15/2025  . Hepatitis C Screening  Completed  . PNA vac Low Risk Adult  Completed   Cancer Screenings:  Colorectal Screening: Completed cologuard 06/2016, due now   Lung Cancer Screening: (Low Dose CT Chest recommended if Age 9-80 years, 30 pack-year currently smoking OR have quit w/in 15years.) does not qualify.   .  Additional Screening:  Hepatitis C Screening: does qualify; Completed 2017  Vision Screening: Recommended annual ophthalmology exams for early detection of glaucoma and other disorders of the eye. Is the patient up to date with their annual eye exam?  Yes  Who is the provider or what is the name of the office in which the pt attends annual eye exams? [facility]    Dental Screening: Recommended annual dental exams for proper oral hygiene  Community Resource Referral:  CRR required this visit?  No        Plan:  I have personally reviewed and addressed the Medicare Annual  Wellness questionnaire and have noted the following in the patient's chart:  A. Medical and social history B. Use of alcohol, tobacco or illicit drugs  C. Current medications and supplements D. Functional ability and status E.  Nutritional status F.  Physical activity G. Advance directives H. List of other physicians I.  Hospitalizations, surgeries, and ER visits in previous 12 months J.  Vitals K. Screenings such as hearing and vision if needed, cognitive and depression L. Referrals and appointments   In addition, I have reviewed and discussed with patient certain preventive protocols, quality metrics, and best practice recommendations. A written personalized care plan for preventive services as well as general preventive health recommendations were provided to patient.   Signed,   Collene Schlichter, LPN  06/02/9240 Nurse Health Advisor   Nurse Notes: none

## 2019-06-18 LAB — BASIC METABOLIC PANEL
BUN/Creatinine Ratio: 13 (ref 10–24)
BUN: 12 mg/dL (ref 8–27)
CO2: 24 mmol/L (ref 20–29)
Calcium: 10.4 mg/dL — ABNORMAL HIGH (ref 8.6–10.2)
Chloride: 104 mmol/L (ref 96–106)
Creatinine, Ser: 0.89 mg/dL (ref 0.76–1.27)
GFR calc Af Amer: 99 mL/min/{1.73_m2} (ref >59)
GFR calc non Af Amer: 86 mL/min/{1.73_m2} (ref >59)
Glucose: 108 mg/dL — ABNORMAL HIGH (ref 65–99)
Potassium: 4.3 mmol/L (ref 3.5–5.2)
Sodium: 142 mmol/L (ref 134–144)

## 2019-06-18 LAB — BAYER DCA HB A1C WAIVED: HB A1C (BAYER DCA - WAIVED): 7.2 % — ABNORMAL HIGH (ref <7.0)

## 2019-06-18 NOTE — Progress Notes (Signed)
Please let Mr. Phoenix know kidney function remains stable.  Calcium slightly elevated, will recheck next visit.  Have a great day!!

## 2019-06-19 ENCOUNTER — Telehealth: Payer: Self-pay | Admitting: Nurse Practitioner

## 2019-06-19 ENCOUNTER — Other Ambulatory Visit: Payer: Self-pay | Admitting: Nurse Practitioner

## 2019-06-19 MED ORDER — OMEPRAZOLE 20 MG PO CPDR: 20.0000 mg | DELAYED_RELEASE_CAPSULE | Freq: Every day | ORAL | 3 refills | Status: AC

## 2019-06-19 NOTE — Telephone Encounter (Signed)
Called pt to clear up message, he states that his wife has omperazole 20mg  and he tried it for his stomach. Pt is wanting to know if he can have a rx sent to the pharmacy due to it helping him. Please advise.    Copied from CRM [id number]. Topic: General - Other >> Jun 19, 2019 10:48 AM Jun 21, 2019 wrote: Reason for CRM: Pt stated he needs a Rx for a medication for his stomach. Pt requests call back.

## 2019-06-19 NOTE — Telephone Encounter (Signed)
Called pt and told him rx sent to pharmacy

## 2019-06-19 NOTE — Telephone Encounter (Signed)
Have sent this in for him and we can discuss further at his next visit.  Thank you.

## 2019-07-09 ENCOUNTER — Other Ambulatory Visit: Payer: Self-pay | Admitting: Nurse Practitioner

## 2019-07-09 NOTE — Telephone Encounter (Signed)
Copied from CRM [id number]. Topic: Quick Communication - Rx Refill/Question >> Jul 09, 2019  4:46 PM Dalphine Handing A wrote: Medication: sildenafil (REVATIO) 20 MG tablet  Has the patient contacted their pharmacy? yes (Agent: If no, request that the patient contact the pharmacy for the refill.) (Agent: If yes, when and what did the pharmacy advise?)contact pcp  Preferred Pharmacy (with phone number or street name):[facility] [address] [place]), [place] - [address]  Phone:  [phone] Fax:  [phone]     Agent: Please be advised that RX refills may take up to 3 business days. We ask that you follow-up with your pharmacy.

## 2019-07-10 MED ORDER — SILDENAFIL CITRATE 20 MG PO TABS: 20.0000 mg | ORAL_TABLET | ORAL | 12 refills | Status: AC | PRN

## 2019-07-10 NOTE — Telephone Encounter (Signed)
LOV: 06/17/2019; NOV: 09/16/2019 both with Aura Dials, NP.  Last filled 01/02/2020 for 50 tabs with 12 refills by Vonita Moss, MD.

## 2019-09-16 ENCOUNTER — Other Ambulatory Visit: Payer: Self-pay

## 2019-09-16 ENCOUNTER — Encounter: Payer: Self-pay | Admitting: Nurse Practitioner

## 2019-09-16 ENCOUNTER — Ambulatory Visit (INDEPENDENT_AMBULATORY_CARE_PROVIDER_SITE_OTHER): Payer: Medicare HMO | Admitting: Nurse Practitioner

## 2019-09-16 VITALS — BP 129/79 | HR 84 | Temp 97.9°F | Wt 152.0 lb

## 2019-09-16 DIAGNOSIS — R809 Proteinuria, unspecified: Secondary | ICD-10-CM

## 2019-09-16 DIAGNOSIS — I10 Essential (primary) hypertension: Secondary | ICD-10-CM

## 2019-09-16 DIAGNOSIS — Z1211 Encounter for screening for malignant neoplasm of colon: Secondary | ICD-10-CM | POA: Diagnosis not present

## 2019-09-16 DIAGNOSIS — E1169 Type 2 diabetes mellitus with other specified complication: Secondary | ICD-10-CM

## 2019-09-16 DIAGNOSIS — L729 Follicular cyst of the skin and subcutaneous tissue, unspecified: Secondary | ICD-10-CM

## 2019-09-16 DIAGNOSIS — E1129 Type 2 diabetes mellitus with other diabetic kidney complication: Secondary | ICD-10-CM | POA: Diagnosis not present

## 2019-09-16 DIAGNOSIS — E1159 Type 2 diabetes mellitus with other circulatory complications: Secondary | ICD-10-CM | POA: Diagnosis not present

## 2019-09-16 DIAGNOSIS — E785 Hyperlipidemia, unspecified: Secondary | ICD-10-CM | POA: Diagnosis not present

## 2019-09-16 DIAGNOSIS — E538 Deficiency of other specified B group vitamins: Secondary | ICD-10-CM

## 2019-09-16 LAB — BAYER DCA HB A1C WAIVED: HB A1C (BAYER DCA - WAIVED): 7.6 % — ABNORMAL HIGH (ref <7.0)

## 2019-09-16 MED ORDER — METFORMIN HCL 1000 MG PO TABS: 1000.0000 mg | ORAL_TABLET | Freq: Two times a day (BID) | ORAL | 4 refills | Status: AC

## 2019-09-16 NOTE — Progress Notes (Signed)
BP 129/79   Pulse 84   Temp 97.9 F (36.6 C) (Oral)   Wt 152 lb (68.9 kg)   SpO2 96%   BMI 27.80 kg/m    Subjective:    Patient ID: Edward Yang, male    DOB: 1950/05/16, 69 y.o.   MRN: [id number]  HPI: Edward Yang is a 69 y.o. male  Chief Complaint  Patient presents with  . Diabetes    cologuard test requested  . Hypertension  . Hyperlipidemia    DIABETES Last A1C 7.2%. Continues on Metformin 500 MG BID and Glipizide 5 MG daily. Does endorse poor diet on occasion, "eats too much and does not exercise enough". Hypoglycemic episodes:no Polydipsia/polyuria:no Visual disturbance:no Chest pain:no Paresthesias:no Glucose Monitoring:no Accucheck frequency: not checking Fasting glucose:  Post prandial:  Evening: Before meals: Taking Insulin?:no Long acting insulin: Short acting insulin: Blood Pressure Monitoring:not checking Retinal Examination:Up to Date Foot Exam:Up to Date Pneumovax:Up to Date Influenza:Up to Date Aspirin:no  HYPERTENSION / HYPERLIPIDEMIA Continues on Pravastatin and Lisinopril. Satisfied with current treatment?yes Duration of hypertension:chronic BP monitoring frequency:not checking BP range:  BP medication side effects:no Duration of hyperlipidemia:chronic Cholesterol medication side effects:no Cholesterol supplements: none Medication compliance:good compliance Aspirin:no Recent stressors:no Recurrent headaches:no Visual changes:no Palpitations:no Dyspnea:no Chest pain:no Lower extremity edema:no Dizzy/lightheaded:no  Relevant past medical, surgical, family and social history reviewed and updated as indicated. Interim medical history since our last visit reviewed. Allergies and medications reviewed and updated.  Review of Systems  Constitutional: Negative for activity change, diaphoresis, fatigue and fever.    Respiratory: Negative for cough, chest tightness, shortness of breath and wheezing.   Cardiovascular: Negative for chest pain, palpitations and leg swelling.  Gastrointestinal: Negative.   Endocrine: Negative for cold intolerance, heat intolerance, polydipsia, polyphagia and polyuria.  Neurological: Negative.   Psychiatric/Behavioral: Negative.     Per HPI unless specifically indicated above     Objective:    BP 129/79   Pulse 84   Temp 97.9 F (36.6 C) (Oral)   Wt 152 lb (68.9 kg)   SpO2 96%   BMI 27.80 kg/m   Wt Readings from Last 3 Encounters:  09/16/19 152 lb (68.9 kg)  06/17/19 151 lb 4.8 oz (68.6 kg)  06/17/19 151 lb 4.8 oz (68.6 kg)    Physical Exam Vitals and nursing note reviewed.  Constitutional:      General: He is awake. He is not in acute distress.    Appearance: He is well-developed and well-groomed. He is not ill-appearing.  HENT:     Head: Normocephalic and atraumatic.     Right Ear: Hearing normal. No drainage.     Left Ear: Hearing normal. No drainage.  Eyes:     General: Lids are normal.        Right eye: No discharge.        Left eye: No discharge.     Conjunctiva/sclera: Conjunctivae normal.     Pupils: Pupils are equal, round, and reactive to light.  Neck:     Vascular: No carotid bruit.  Cardiovascular:     Rate and Rhythm: Normal rate and regular rhythm.     Heart sounds: Normal heart sounds, S1 normal and S2 normal. No murmur heard.  No gallop.   Pulmonary:     Effort: Pulmonary effort is normal. No accessory muscle usage or respiratory distress.     Breath sounds: Normal breath sounds.  Abdominal:     General: Bowel sounds are normal.  Palpations: Abdomen is soft.  Musculoskeletal:        General: Normal range of motion.     Cervical back: Normal range of motion and neck supple.     Right lower leg: No edema.     Left lower leg: No edema.  Skin:    General: Skin is warm and dry.     Capillary Refill: Capillary refill takes  less than 2 seconds.       Neurological:     Mental Status: He is alert and oriented to person, place, and time.  Psychiatric:        Attention and Perception: Attention normal.        Mood and Affect: Mood normal.        Speech: Speech normal.        Behavior: Behavior normal. Behavior is cooperative.        Thought Content: Thought content normal.     Results for orders placed or performed in visit on 06/17/19  Bayer DCA Hb A1c Waived  Result Value Ref Range   HB A1C (BAYER DCA - WAIVED) 7.2 (H) <7.0 %  Basic metabolic panel  Result Value Ref Range   Glucose 108 (H) 65 - 99 mg/dL   BUN 12 8 - 27 mg/dL   Creatinine, Ser 6.19 0.76 - 1.27 mg/dL   GFR calc non Af Amer 86 >59 mL/min/1.73   GFR calc Af Amer 99 >59 mL/min/1.73   BUN/Creatinine Ratio 13 10 - 24   Sodium 142 134 - 144 mmol/L   Potassium 4.3 3.5 - 5.2 mmol/L   Chloride 104 96 - 106 mmol/L   CO2 24 20 - 29 mmol/L   Calcium 10.4 (H) 8.6 - 10.2 mg/dL      Assessment & Plan:   Problem List Items Addressed This Visit      Cardiovascular and Mediastinum   Hypertension associated with diabetes (HCC)    Chronic, ongoing with BP below goal today.  Continue current medication regimen and adjust as needed.  BMP today.  Recommend he check BP at home at least 3 mornings a week, document and focus on DASH diet.  Continue Lisinopril for kidney protection.  Return in 3 months.      Relevant Medications   metFORMIN (GLUCOPHAGE) 1000 MG tablet   Other Relevant Orders   Basic metabolic panel   Bayer DCA Hb J0D Waived     Endocrine   Hyperlipidemia associated with type 2 diabetes mellitus (HCC)    Chronic, ongoing.  Continue current medication regimen and adjust as needed.  Obtain lipid panel next visit, recent LDL 86, may benefit from change to Atorvastatin or Crestor next visit for tighter control if LDL >70.        Relevant Medications   metFORMIN (GLUCOPHAGE) 1000 MG tablet   Other Relevant Orders   Bayer DCA Hb A1c  Waived   Lipid Panel w/o Chol/HDL Ratio   Type 2 diabetes mellitus with proteinuria (HCC) - Primary    Chronic, ongoing with A1C trending upwards this visit to 7.6%.  Continue Glipizide, but increase Metformin to 1000 MG BID, discussed with patient and educated him on this.  Recommend he check BS at home daily with goal morning fasting BS <130, especially since on Glipizide, monitor for hypoglycemia.  Would consider addition of GLP or SGLT at next visit if ongoing elevation, then may be able to discontinue Glipizide.  Continue Lisinopril for kidney protection. Return to office in 3 months.  Relevant Medications   metFORMIN (GLUCOPHAGE) 1000 MG tablet   Other Relevant Orders   Bayer DCA Hb A1c Waived     Musculoskeletal and Integument   Cyst of skin    To anterior left shoulder.  Patient denies any symptoms and no red flags on exam.  Has had for almost one year and he denies any changes.  Continue to monitor and if any changes or discomfort then will perform removal of cyst.       Other Visit Diagnoses    B12 deficiency       Reports history of low level, check today and initiate supplement as needed.  Is on daily Metformin.   Relevant Orders   B12   Colon cancer screening       Cologuard ordered   Relevant Orders   Cologuard       Follow up plan: Return in about 3 months (around 12/17/2019) for Annual physical.

## 2019-09-16 NOTE — Assessment & Plan Note (Signed)
Chronic, ongoing.  Continue current medication regimen and adjust as needed.  Obtain lipid panel next visit, recent LDL 86, may benefit from change to Atorvastatin or Crestor next visit for tighter control if LDL >70.

## 2019-09-16 NOTE — Assessment & Plan Note (Signed)
Chronic, ongoing with A1C trending upwards this visit to 7.6%.  Continue Glipizide, but increase Metformin to 1000 MG BID, discussed with patient and educated him on this.  Recommend he check BS at home daily with goal morning fasting BS <130, especially since on Glipizide, monitor for hypoglycemia.  Would consider addition of GLP or SGLT at next visit if ongoing elevation, then may be able to discontinue Glipizide.  Continue Lisinopril for kidney protection. Return to office in 3 months.

## 2019-09-16 NOTE — Assessment & Plan Note (Signed)
Chronic, ongoing with BP below goal today.  Continue current medication regimen and adjust as needed.  BMP today.  Recommend he check BP at home at least 3 mornings a week, document and focus on DASH diet.  Continue Lisinopril for kidney protection.  Return in 3 months.

## 2019-09-16 NOTE — Assessment & Plan Note (Signed)
To anterior left shoulder.  Patient denies any symptoms and no red flags on exam.  Has had for almost one year and he denies any changes.  Continue to monitor and if any changes or discomfort then will perform removal of cyst.

## 2019-09-16 NOTE — Patient Instructions (Signed)

## 2019-09-17 LAB — LIPID PANEL W/O CHOL/HDL RATIO
Cholesterol, Total: 162 mg/dL (ref 100–199)
HDL: 54 mg/dL (ref >39)
LDL Chol Calc (NIH): 73 mg/dL (ref 0–99)
Triglycerides: 210 mg/dL — ABNORMAL HIGH (ref 0–149)
VLDL Cholesterol Cal: 35 mg/dL (ref 5–40)

## 2019-09-17 LAB — BASIC METABOLIC PANEL
BUN/Creatinine Ratio: 17 (ref 10–24)
BUN: 15 mg/dL (ref 8–27)
CO2: 23 mmol/L (ref 20–29)
Calcium: 9.5 mg/dL (ref 8.6–10.2)
Chloride: 104 mmol/L (ref 96–106)
Creatinine, Ser: 0.89 mg/dL (ref 0.76–1.27)
GFR calc Af Amer: 99 mL/min/{1.73_m2} (ref >59)
GFR calc non Af Amer: 86 mL/min/{1.73_m2} (ref >59)
Glucose: 121 mg/dL — ABNORMAL HIGH (ref 65–99)
Potassium: 4.2 mmol/L (ref 3.5–5.2)
Sodium: 140 mmol/L (ref 134–144)

## 2019-09-17 LAB — VITAMIN B12: Vitamin B-12: 639 pg/mL (ref 232–1245)

## 2019-09-17 NOTE — Progress Notes (Signed)
Please let Edward Yang know his labs have returned and kidney function remains stable.  Cholesterol levels are close to goal, with exception of ongoing mild elevation in triglycerides, continue to focus on diet at home. B12 level is normal.  His cologuard has been order, please let us know if you do not get this sent to your house.  Have a wonderful day!!

## 2019-10-03 LAB — COLOGUARD: Cologuard: NEGATIVE

## 2019-10-04 ENCOUNTER — Telehealth: Payer: Self-pay

## 2019-10-04 NOTE — Telephone Encounter (Signed)
Cologuard was received by [facility] and the sample could not be processed.  This usually means not enough was collected.   No action needed, [facility] will contact the patient for a new sample collection. FYI only.

## 2019-12-25 ENCOUNTER — Encounter: Payer: Medicare HMO | Admitting: Nurse Practitioner

## 2020-06-22 ENCOUNTER — Ambulatory Visit: Payer: Medicare HMO

## 2020-11-11 ENCOUNTER — Other Ambulatory Visit: Payer: Self-pay | Admitting: Nurse Practitioner

## 2020-11-12 NOTE — Telephone Encounter (Signed)
Requested medications are due for refill today.  yes  Requested medications are on the active medications list.  yes  Last refill. 07/10/2019  Future visit scheduled.   no  Notes to clinic.  Per chart no PCP. Over 1 year since pt has been seen.
